# Patient Record
Sex: Female | Born: 1975 | Race: White | Hispanic: No | Marital: Married | Smoking: Never smoker
Health system: Southern US, Community
[De-identification: ages and names within clinical notes are randomized; demographics above are authoritative.]

## PROBLEM LIST (undated history)

## (undated) DIAGNOSIS — M1990 Unspecified osteoarthritis, unspecified site: Secondary | ICD-10-CM

## (undated) DIAGNOSIS — G43909 Migraine, unspecified, not intractable, without status migrainosus: Secondary | ICD-10-CM

## (undated) DIAGNOSIS — Z9889 Other specified postprocedural states: Secondary | ICD-10-CM

## (undated) DIAGNOSIS — Z8719 Personal history of other diseases of the digestive system: Secondary | ICD-10-CM

## (undated) DIAGNOSIS — T8859XA Other complications of anesthesia, initial encounter: Secondary | ICD-10-CM

## (undated) DIAGNOSIS — Z87442 Personal history of urinary calculi: Secondary | ICD-10-CM

## (undated) DIAGNOSIS — F32A Depression, unspecified: Secondary | ICD-10-CM

## (undated) DIAGNOSIS — K219 Gastro-esophageal reflux disease without esophagitis: Secondary | ICD-10-CM

## (undated) DIAGNOSIS — F419 Anxiety disorder, unspecified: Secondary | ICD-10-CM

## (undated) DIAGNOSIS — R112 Nausea with vomiting, unspecified: Secondary | ICD-10-CM

## (undated) HISTORY — DX: Unspecified osteoarthritis, unspecified site: M19.90

## (undated) HISTORY — DX: Migraine, unspecified, not intractable, without status migrainosus: G43.909

## (undated) HISTORY — DX: Gastro-esophageal reflux disease without esophagitis: K21.9

## (undated) HISTORY — DX: Depression, unspecified: F32.A

## (undated) HISTORY — PX: WISDOM TOOTH EXTRACTION: SHX21

## (undated) HISTORY — PX: DILATION AND CURETTAGE OF UTERUS: SHX78

## (undated) HISTORY — DX: Anxiety disorder, unspecified: F41.9

---

## 1998-06-21 ENCOUNTER — Other Ambulatory Visit: Admission: RE | Admit: 1998-06-21 | Discharge: 1998-06-21 | Payer: Self-pay | Admitting: Obstetrics and Gynecology

## 1998-12-27 ENCOUNTER — Other Ambulatory Visit: Admission: RE | Admit: 1998-12-27 | Discharge: 1998-12-27 | Payer: Self-pay | Admitting: Gynecology

## 2000-04-25 ENCOUNTER — Other Ambulatory Visit: Admission: RE | Admit: 2000-04-25 | Discharge: 2000-04-25 | Payer: Self-pay | Admitting: Gynecology

## 2000-05-27 ENCOUNTER — Other Ambulatory Visit: Admission: RE | Admit: 2000-05-27 | Discharge: 2000-05-27 | Payer: Self-pay | Admitting: Gynecology

## 2000-08-20 ENCOUNTER — Ambulatory Visit (HOSPITAL_COMMUNITY): Admission: RE | Admit: 2000-08-20 | Discharge: 2000-08-20 | Payer: Self-pay | Admitting: Gynecology

## 2000-10-17 ENCOUNTER — Encounter: Payer: Self-pay | Admitting: Gynecology

## 2000-10-17 ENCOUNTER — Ambulatory Visit (HOSPITAL_COMMUNITY): Admission: RE | Admit: 2000-10-17 | Discharge: 2000-10-17 | Payer: Self-pay | Admitting: Gynecology

## 2001-03-09 ENCOUNTER — Other Ambulatory Visit: Admission: RE | Admit: 2001-03-09 | Discharge: 2001-03-09 | Payer: Self-pay | Admitting: Gynecology

## 2001-06-03 ENCOUNTER — Encounter: Admission: RE | Admit: 2001-06-03 | Discharge: 2001-09-01 | Payer: Self-pay | Admitting: Gynecology

## 2001-09-16 ENCOUNTER — Inpatient Hospital Stay (HOSPITAL_COMMUNITY): Admission: AD | Admit: 2001-09-16 | Discharge: 2001-09-18 | Payer: Self-pay | Admitting: Gynecology

## 2001-10-21 ENCOUNTER — Other Ambulatory Visit: Admission: RE | Admit: 2001-10-21 | Discharge: 2001-10-21 | Payer: Self-pay | Admitting: Gynecology

## 2002-10-21 ENCOUNTER — Other Ambulatory Visit: Admission: RE | Admit: 2002-10-21 | Discharge: 2002-10-21 | Payer: Self-pay | Admitting: Gynecology

## 2002-11-09 ENCOUNTER — Emergency Department (HOSPITAL_COMMUNITY): Admission: EM | Admit: 2002-11-09 | Discharge: 2002-11-09 | Payer: Self-pay | Admitting: Emergency Medicine

## 2003-07-27 ENCOUNTER — Other Ambulatory Visit: Admission: RE | Admit: 2003-07-27 | Discharge: 2003-07-27 | Payer: Self-pay | Admitting: Gynecology

## 2004-02-15 ENCOUNTER — Inpatient Hospital Stay (HOSPITAL_COMMUNITY): Admission: AD | Admit: 2004-02-15 | Discharge: 2004-02-15 | Payer: Self-pay | Admitting: Gynecology

## 2004-02-17 ENCOUNTER — Inpatient Hospital Stay (HOSPITAL_COMMUNITY): Admission: AD | Admit: 2004-02-17 | Discharge: 2004-02-19 | Payer: Self-pay | Admitting: Gynecology

## 2004-03-30 ENCOUNTER — Other Ambulatory Visit: Admission: RE | Admit: 2004-03-30 | Discharge: 2004-03-30 | Payer: Self-pay | Admitting: Gynecology

## 2005-04-08 ENCOUNTER — Other Ambulatory Visit: Admission: RE | Admit: 2005-04-08 | Discharge: 2005-04-08 | Payer: Self-pay | Admitting: Gynecology

## 2005-04-23 ENCOUNTER — Encounter: Admission: RE | Admit: 2005-04-23 | Discharge: 2005-04-23 | Payer: Self-pay | Admitting: Gynecology

## 2006-04-14 ENCOUNTER — Other Ambulatory Visit: Admission: RE | Admit: 2006-04-14 | Discharge: 2006-04-14 | Payer: Self-pay | Admitting: Gynecology

## 2007-02-06 ENCOUNTER — Encounter: Payer: Self-pay | Admitting: Gynecology

## 2007-02-06 ENCOUNTER — Ambulatory Visit (HOSPITAL_BASED_OUTPATIENT_CLINIC_OR_DEPARTMENT_OTHER): Admission: RE | Admit: 2007-02-06 | Discharge: 2007-02-06 | Payer: Self-pay | Admitting: Gynecology

## 2007-05-28 ENCOUNTER — Other Ambulatory Visit: Admission: RE | Admit: 2007-05-28 | Discharge: 2007-05-28 | Payer: Self-pay | Admitting: Gynecology

## 2008-11-15 ENCOUNTER — Ambulatory Visit (HOSPITAL_COMMUNITY): Admission: RE | Admit: 2008-11-15 | Discharge: 2008-11-15 | Payer: Self-pay | Admitting: Obstetrics and Gynecology

## 2010-02-12 ENCOUNTER — Encounter: Admission: RE | Admit: 2010-02-12 | Discharge: 2010-02-12 | Payer: Self-pay | Admitting: Obstetrics and Gynecology

## 2011-02-05 NOTE — Op Note (Signed)
Kathleen Bird, Kathleen Bird            ACCOUNT NO.:  [id number]   MEDICAL RECORD NO.:  [id number]          PATIENT TYPE:  AMB   LOCATION:  [facility]                         FACILITY:  [facility]   PHYSICIAN:  Timothy P. Fontaine, M.D.DATE OF BIRTH:  [birth year]   DATE OF PROCEDURE:  02/06/2007  DATE OF DISCHARGE:                               OPERATIVE REPORT   PREOPERATIVE DIAGNOSIS:  Missed abortion.   POSTOPERATIVE DIAGNOSIS:  Missed abortion.   PROCEDURE:  Suction dilatation and curettage/   SURGEON:  Timothy P. Fontaine, M.D.   ANESTHETIC:  MAC with 1% lidocaine paracervical block.   ESTIMATED BLOOD LOSS:  Minimal.   COMPLICATIONS:  None.   SPECIMEN:  Products of conception.   FINDINGS:  EUA:  External, BUS, vagina grossly normal.  Cervix normal.  Uterus anteverted, normal-sized to mildly enlarged, soft.  Adnexa  without masses.  A #7 suction curette used.  Gross POC retrieved.   PROCEDURE:  The patient was taken to the operating room, underwent IV  sedation and was placed in low dorsal lithotomy position and received a  perineal and vaginal preparation with Betadine solution and an EUA was  performed.  The patient voided immediately prior to moving to the OR and  a catheterization was not done.  The patient was draped in the usual  fashion, cervix visualized with a speculum, paracervical block using 1%  lidocaine used, total of 10 mL.  Single-tooth tenaculum, anterior lip  stabilization.  The cervix was gently and gradually dilated to admit the  #7 suction curette.  A suction curettage was performed, gross POC  retrieved.  A gentle sharp curettage was performed to assure complete  emptying.  There was a mild arcuate pattern to the uterine cavity with  the sharp curettage that was noted.  The instruments were removed,  hemostasis visualized.  The speculum removed.  The patient placed in  supine position, was taken to the recovery room in good condition having  tolerated the  procedure well.      Timothy P. Fontaine, M.D.  Electronically Signed     TPF/MEDQ  D:  02/06/2007  T:  02/06/2007  Job:  [id number]

## 2011-02-05 NOTE — H&P (Signed)
[name], [name]            ACCOUNT NO.:  [id number]   MEDICAL RECORD NO.:  [id number]          PATIENT TYPE:  AMB   LOCATION:  [facility]                         FACILITY:  [facility]   PHYSICIAN:  Timothy P. Fontaine, M.D.DATE OF BIRTH:  [birth year]   DATE OF ADMISSION:  DATE OF DISCHARGE:                              HISTORY & PHYSICAL   CHIEF COMPLAINT:  Missed abortion.   HISTORY OF PRESENT ILLNESS:  [name], P2, [name] presents  with cramping and some spotting.  Ultrasound revealed intrauterine  pregnancy with large yolk sac.  Fetal pole identified.  No cardiac  activity.  Subchorionic hematoma identified all consistent with a missed  abortion.  The patient was reviewed the options to include expectant  management versus D&C and she wants to proceed with suction D&C.  Her  blood type is B+.   PAST MEDICAL HISTORY:  Uncomplicated.   PAST SURGICAL HISTORY:  Uncomplicated.   ALLERGIES:  No medications.   REVIEW OF SYSTEMS:  Noncontributory.   FAMILY HISTORY:  Noncontributory.   SOCIAL HISTORY:  Noncontributory.   ADMISSION PHYSICAL EXAMINATION:  VITAL SIGNS:  Afebrile.  Vital signs  are stable.  HEENT:  Normal.  LUNGS:  Clear.  CARDIAC:  Regular rate.  No rubs, murmurs or gallops.  ABDOMINAL EXAM:  Benign.  PELVIC:  External BUS.  Vagina normal.  Cervix grossly normal, closed.  Uterus soft.  Grossly normal size, midline, mobile and nontender.  Adnexa without masses or tenderness.   ASSESSMENT:  [name] P2 [name] missed abortion 6 weeks for  suction D&C.  The options for expectant management as well as chemical-  stimulated abortion such as Cytotec/RU486 discussed and the patient  elects for suction D&C.  The expected intraoperative and postoperative  courses, the intraoperative and postoperative risks were reviewed to  include bleeding, transfusion, infection, uterine perforation, damage to  internal organs,  bowel, bladder, ureters, vessels  and nerves necessitating major  exploratory reparative surgeries, future reparative surgeries, ostomy  formation, bowel resection all discussed, understood and accepted.  The  patient's questions were answered to her satisfaction and she is ready  to proceed with surg      Timothy P. Fontaine, M.D.  Electronically Signed     TPF/MEDQ  D:  02/06/2007  T:  02/06/2007  Job:  [id number]

## 2011-02-08 NOTE — H&P (Signed)
NAME:  Kathleen Bird, Kathleen Bird                      ACCOUNT NO.:  [id number]   MEDICAL RECORD NO.:  [id number]                   PATIENT TYPE:  MAT   LOCATION:  [facility]                                 FACILITY:  [facility]   PHYSICIAN:  Timothy P. Fontaine, M.D.           DATE OF BIRTH:  September 17, 1976   DATE OF ADMISSION:  02/17/2004  DATE OF DISCHARGE:                                HISTORY & PHYSICAL   CHIEF COMPLAINT:  Term pregnancy, favorable cervix.   HISTORY OF PRESENT ILLNESS:  A 35 year old, G2, P77 female at [redacted] weeks  gestation, admitted for induction due to favorable cervix at term.  She was  found to be 2-3 cm dilated, 50% effaced, -1 to -2 station on her last  obstetric visit, and is starting to have increasing bouts of contractions,  and is admitted for induction.  For the remainder of her history and  physical, see her [place] exam.   PHYSICAL EXAMINATION:  HEENT:  Normal.  LUNGS:  Clear.  CARDIAC:  Regular rate.  No murmurs, rubs, or gallops.  ABDOMEN:  Gravid.  Vertex fetus consistent with term pregnancy.  Positive  fetal heart tones.  Cervix 2-3 cm, 50%, -2 station, vertex presentation.   ASSESSMENT AND PLAN:  A 35 year old, G2, P3 female, term gestation,  favorable cervix for induction.  Beta strep is negative.  The options of  continuing expectant management versus induction was discussed, and the  patient would prefer induction.                                               Timothy P. Audie Box, M.D.    TPF/MEDQ  D:  02/15/2004  T:  02/16/2004  Job:  [id number]

## 2011-02-08 NOTE — Discharge Summary (Signed)
[facility]  Patient:    Kathleen Bird, Kathleen Bird Visit Number: [id number] MRN: [id number]          Service Type: OBS Location: 910A [id number] [id number] Attending Physician:  Douglass Rivers Dictated by:   Antony Contras, [facility] Admit Date:  09/16/2001 Discharge Date: 09/18/2001                             Discharge Summary  DISCHARGE DIAGNOSES: 1. Intrauterine pregnancy at term. 2. Spontaneous onset of labor.  PROCEDURE:  Normal spontaneous vaginal delivery of a viable infant over a intact perineum with repair of second degree laceration.  HISTORY OF PRESENT ILLNESS:  The patient is a 35 year old primigravida with a last menstrual period of 12/17/00, estimated date of confinement 09/23/01. Prenatal history includes history of ______ pregnancy with recurrent urinary tract infections, marginal placenta previa which resolved at 20 weeks.  LABORATORY DATA:  Blood type B+, antibody screen negative.  RPR nonreactive. Rubella immune.  GBS is negative.  HOSPITAL COURSE AND TREATMENT:  The patient was admitted on 09/16/01, spontaneous onset of labor.  Cervix on admission was 3 cm, 80% effaced.  The patient progressed to complete dilatation.  Delivered an Apgar 20 and 50 female infant weighing 7 pounds 1 ounce over intact perineum with repair of second degree laceration.  Terminal meconium was noted.  POSTPARTUM COURSE:  The patient remained afebrile, had no difficulty voiding, was able to be discharged in satisfactory condition on her second postpartum day.  CBC showed a hematocrit of 27.3, hemoglobin 9.2, white blood cell count 10.3, platelets 193.  DISPOSITION:  Follow up in six weeks.  DISCHARGE MEDICATIONS: 1. Continue prenatal vitamins and iron. 2. Percocet. 3. Motrin for pain. Dictated by:   Antony Contras, [facility] Attending Physician:  Douglass Rivers DD:  10/02/01 TD:  10/04/01 Job: [id number]

## 2012-11-11 ENCOUNTER — Other Ambulatory Visit: Payer: Self-pay | Admitting: Obstetrics and Gynecology

## 2012-11-11 DIAGNOSIS — Z803 Family history of malignant neoplasm of breast: Secondary | ICD-10-CM

## 2013-01-04 ENCOUNTER — Other Ambulatory Visit: Payer: Self-pay | Admitting: Obstetrics and Gynecology

## 2013-01-04 DIAGNOSIS — R928 Other abnormal and inconclusive findings on diagnostic imaging of breast: Secondary | ICD-10-CM

## 2013-01-15 ENCOUNTER — Ambulatory Visit
Admission: RE | Admit: 2013-01-15 | Discharge: 2013-01-15 | Disposition: A | Discharge status: Home / Self Care | Payer: BC Managed Care – PPO | Source: Ambulatory Visit | Attending: Obstetrics and Gynecology | Admitting: Obstetrics and Gynecology

## 2013-01-15 DIAGNOSIS — R928 Other abnormal and inconclusive findings on diagnostic imaging of breast: Secondary | ICD-10-CM

## 2013-06-10 ENCOUNTER — Other Ambulatory Visit: Payer: Self-pay | Admitting: Family Medicine

## 2013-06-10 DIAGNOSIS — Z8249 Family history of ischemic heart disease and other diseases of the circulatory system: Secondary | ICD-10-CM

## 2013-06-18 ENCOUNTER — Ambulatory Visit
Admission: RE | Admit: 2013-06-18 | Discharge: 2013-06-18 | Disposition: A | Discharge status: Home / Self Care | Payer: BC Managed Care – PPO | Source: Ambulatory Visit | Attending: Family Medicine | Admitting: Family Medicine

## 2013-06-18 DIAGNOSIS — Z8249 Family history of ischemic heart disease and other diseases of the circulatory system: Secondary | ICD-10-CM

## 2013-06-18 MED ORDER — GADOBENATE DIMEGLUMINE 529 MG/ML IV SOLN
10.0000 mL | Freq: Once | INTRAVENOUS | Status: AC | PRN
  Administered 2013-06-18: 10 mL via INTRAVENOUS

## 2013-12-07 ENCOUNTER — Other Ambulatory Visit: Payer: Self-pay | Admitting: Endocrinology

## 2013-12-07 DIAGNOSIS — E049 Nontoxic goiter, unspecified: Secondary | ICD-10-CM

## 2013-12-09 ENCOUNTER — Ambulatory Visit
Admission: RE | Admit: 2013-12-09 | Discharge: 2013-12-09 | Disposition: A | Discharge status: Home / Self Care | Payer: BC Managed Care – PPO | Source: Ambulatory Visit | Attending: Endocrinology | Admitting: Endocrinology

## 2013-12-09 DIAGNOSIS — E049 Nontoxic goiter, unspecified: Secondary | ICD-10-CM

## 2014-05-27 ENCOUNTER — Other Ambulatory Visit: Payer: Self-pay | Admitting: Obstetrics and Gynecology

## 2014-05-31 LAB — CYTOLOGY - PAP

## 2014-10-03 ENCOUNTER — Other Ambulatory Visit: Payer: Self-pay | Admitting: Endocrinology

## 2014-10-03 DIAGNOSIS — E049 Nontoxic goiter, unspecified: Secondary | ICD-10-CM

## 2014-12-12 ENCOUNTER — Ambulatory Visit
Admission: RE | Admit: 2014-12-12 | Discharge: 2014-12-12 | Disposition: A | Discharge status: Home / Self Care | Payer: BC Managed Care – PPO | Source: Ambulatory Visit | Attending: Endocrinology | Admitting: Endocrinology

## 2014-12-12 DIAGNOSIS — E049 Nontoxic goiter, unspecified: Secondary | ICD-10-CM

## 2015-01-10 ENCOUNTER — Other Ambulatory Visit: Payer: Self-pay | Admitting: Obstetrics and Gynecology

## 2015-01-11 LAB — CYTOLOGY - PAP

## 2015-12-29 ENCOUNTER — Other Ambulatory Visit: Payer: Self-pay | Admitting: Endocrinology

## 2015-12-29 DIAGNOSIS — E049 Nontoxic goiter, unspecified: Secondary | ICD-10-CM

## 2016-01-02 ENCOUNTER — Ambulatory Visit
Admission: RE | Admit: 2016-01-02 | Discharge: 2016-01-02 | Disposition: A | Discharge status: Home / Self Care | Payer: BC Managed Care – PPO | Source: Ambulatory Visit | Attending: Endocrinology | Admitting: Endocrinology

## 2016-01-02 DIAGNOSIS — E049 Nontoxic goiter, unspecified: Secondary | ICD-10-CM

## 2017-01-21 ENCOUNTER — Other Ambulatory Visit: Payer: Self-pay | Admitting: Obstetrics and Gynecology

## 2017-01-21 DIAGNOSIS — N6310 Unspecified lump in the right breast, unspecified quadrant: Secondary | ICD-10-CM

## 2017-01-21 DIAGNOSIS — N644 Mastodynia: Secondary | ICD-10-CM

## 2017-01-24 ENCOUNTER — Ambulatory Visit
Admission: RE | Admit: 2017-01-24 | Discharge: 2017-01-24 | Disposition: A | Discharge status: Home / Self Care | Payer: BC Managed Care – PPO | Source: Ambulatory Visit | Attending: Obstetrics and Gynecology | Admitting: Obstetrics and Gynecology

## 2017-01-24 DIAGNOSIS — N6310 Unspecified lump in the right breast, unspecified quadrant: Secondary | ICD-10-CM

## 2017-01-24 DIAGNOSIS — N644 Mastodynia: Secondary | ICD-10-CM

## 2017-05-15 ENCOUNTER — Other Ambulatory Visit: Payer: Self-pay | Admitting: Gastroenterology

## 2017-05-15 DIAGNOSIS — R1310 Dysphagia, unspecified: Secondary | ICD-10-CM

## 2017-05-16 ENCOUNTER — Ambulatory Visit
Admission: RE | Admit: 2017-05-16 | Discharge: 2017-05-16 | Disposition: A | Discharge status: Home / Self Care | Payer: BC Managed Care – PPO | Source: Ambulatory Visit | Attending: Gastroenterology | Admitting: Gastroenterology

## 2017-05-16 DIAGNOSIS — R1310 Dysphagia, unspecified: Secondary | ICD-10-CM

## 2017-05-19 ENCOUNTER — Other Ambulatory Visit: Payer: BC Managed Care – PPO

## 2017-11-25 ENCOUNTER — Other Ambulatory Visit: Payer: Self-pay | Admitting: Endocrinology

## 2017-11-25 DIAGNOSIS — E049 Nontoxic goiter, unspecified: Secondary | ICD-10-CM

## 2017-12-22 ENCOUNTER — Ambulatory Visit
Admission: RE | Admit: 2017-12-22 | Discharge: 2017-12-22 | Disposition: A | Discharge status: Home / Self Care | Payer: BC Managed Care – PPO | Source: Ambulatory Visit | Attending: Endocrinology | Admitting: Endocrinology

## 2017-12-22 DIAGNOSIS — E049 Nontoxic goiter, unspecified: Secondary | ICD-10-CM

## 2018-02-02 ENCOUNTER — Other Ambulatory Visit: Payer: Self-pay | Admitting: Obstetrics and Gynecology

## 2018-02-02 DIAGNOSIS — N6315 Unspecified lump in the right breast, overlapping quadrants: Secondary | ICD-10-CM

## 2018-02-02 DIAGNOSIS — N6310 Unspecified lump in the right breast, unspecified quadrant: Principal | ICD-10-CM

## 2018-02-04 ENCOUNTER — Ambulatory Visit
Admission: RE | Admit: 2018-02-04 | Discharge: 2018-02-04 | Disposition: A | Discharge status: Home / Self Care | Payer: BC Managed Care – PPO | Source: Ambulatory Visit | Attending: Obstetrics and Gynecology | Admitting: Obstetrics and Gynecology

## 2018-02-04 DIAGNOSIS — N6310 Unspecified lump in the right breast, unspecified quadrant: Principal | ICD-10-CM

## 2018-02-04 DIAGNOSIS — N6315 Unspecified lump in the right breast, overlapping quadrants: Secondary | ICD-10-CM

## 2019-04-21 ENCOUNTER — Other Ambulatory Visit: Payer: Self-pay | Admitting: Endocrinology

## 2019-04-21 DIAGNOSIS — E049 Nontoxic goiter, unspecified: Secondary | ICD-10-CM

## 2019-04-30 ENCOUNTER — Other Ambulatory Visit: Payer: BC Managed Care – PPO

## 2019-05-07 ENCOUNTER — Ambulatory Visit
Admission: RE | Admit: 2019-05-07 | Discharge: 2019-05-07 | Disposition: A | Discharge status: Home / Self Care | Payer: BC Managed Care – PPO | Source: Ambulatory Visit | Attending: Endocrinology | Admitting: Endocrinology

## 2019-05-07 DIAGNOSIS — E049 Nontoxic goiter, unspecified: Secondary | ICD-10-CM

## 2020-01-19 DIAGNOSIS — N920 Excessive and frequent menstruation with regular cycle: Secondary | ICD-10-CM | POA: Insufficient documentation

## 2020-06-02 ENCOUNTER — Ambulatory Visit: Payer: Self-pay

## 2020-06-02 ENCOUNTER — Other Ambulatory Visit: Payer: Self-pay

## 2020-06-02 ENCOUNTER — Other Ambulatory Visit: Payer: Self-pay | Admitting: Occupational Medicine

## 2020-06-02 DIAGNOSIS — M25562 Pain in left knee: Secondary | ICD-10-CM

## 2020-12-22 HISTORY — PX: COLONOSCOPY: SHX174

## 2020-12-22 HISTORY — PX: UPPER GASTROINTESTINAL ENDOSCOPY: SHX188

## 2021-02-15 ENCOUNTER — Other Ambulatory Visit: Payer: Self-pay | Admitting: Urology

## 2021-02-15 DIAGNOSIS — N201 Calculus of ureter: Secondary | ICD-10-CM

## 2021-02-22 ENCOUNTER — Encounter (HOSPITAL_BASED_OUTPATIENT_CLINIC_OR_DEPARTMENT_OTHER): Payer: Self-pay | Admitting: Urology

## 2021-02-22 NOTE — Progress Notes (Signed)
Patient to arrive at 1030 on 02/26/2021. History and medications reviewed. Pre-procedure instructions given. NPO after MN on Sunday except for clear liquids until 0830. Driver secured.

## 2021-02-23 NOTE — Progress Notes (Signed)
Patient instructed to arrive at [place] on 02/26/2021 due to procedure time change. Clear liquids until 0715.

## 2021-02-25 NOTE — H&P (Signed)
H&P  Chief Complaint: Rt sided kidney stone  History of Present Illness: Kathleen Bird is a 45 y.o. year old female presents for ESL for mgmt of a 4 mm Rt UPJ stone.  Past Medical History:  Diagnosis Date  . History of kidney stones     Past Surgical History:  Procedure Laterality Date  . tendon replacement Right 07/2020    Home Medications:  No medications prior to admission.    Allergies: No Known Allergies  No family history on file.  Social History:  has no history on file for tobacco use, alcohol use, and drug use.  ROS: A complete review of systems was performed.  All systems are negative except for pertinent findings as noted.  Physical Exam:  Vital signs in last 24 hours: BP: ()/()  Arterial Line BP: ()/()  General:  Alert and oriented, No acute distress HEENT: Normocephalic, atraumatic Neck: No JVD or lymphadenopathy Cardiovascular: Regular rate  Lungs: Normal inspiratory/expiratory excursion Abdomen: Soft, nontender, nondistended, no abdominal masses Back: No CVA tenderness Extremities: No edema Neurologic: Grossly intact  Laboratory Data:  No results found for this or any previous visit (from the past 24 hour(s)). No results found for this or any previous visit (from the past 240 hour(s)). Creatinine: No results for input(s): CREATININE in the last 168 hours.  Radiologic Imaging: No results found.  Impression/Assessment:  Rt UPJ stone  Plan:  ESL  [name] [name] 02/25/2021, 7:26 PM  [name]. Gillie Fleites MD

## 2021-02-26 ENCOUNTER — Other Ambulatory Visit: Payer: Self-pay

## 2021-02-26 ENCOUNTER — Ambulatory Visit (HOSPITAL_BASED_OUTPATIENT_CLINIC_OR_DEPARTMENT_OTHER)
Admission: RE | Admit: 2021-02-26 | Discharge: 2021-02-26 | Disposition: A | Discharge status: Home / Self Care | Payer: BC Managed Care – PPO | Attending: Urology | Admitting: Urology

## 2021-02-26 ENCOUNTER — Encounter (HOSPITAL_BASED_OUTPATIENT_CLINIC_OR_DEPARTMENT_OTHER)
Admission: RE | Disposition: A | Discharge status: Home / Self Care | Payer: Self-pay | Source: Home / Self Care | Attending: Urology

## 2021-02-26 ENCOUNTER — Ambulatory Visit (HOSPITAL_COMMUNITY): Payer: BC Managed Care – PPO

## 2021-02-26 ENCOUNTER — Encounter (HOSPITAL_BASED_OUTPATIENT_CLINIC_OR_DEPARTMENT_OTHER): Payer: Self-pay | Admitting: Urology

## 2021-02-26 DIAGNOSIS — Z87442 Personal history of urinary calculi: Secondary | ICD-10-CM | POA: Insufficient documentation

## 2021-02-26 DIAGNOSIS — Z538 Procedure and treatment not carried out for other reasons: Secondary | ICD-10-CM | POA: Insufficient documentation

## 2021-02-26 DIAGNOSIS — N201 Calculus of ureter: Secondary | ICD-10-CM | POA: Diagnosis present

## 2021-02-26 HISTORY — PX: EXTRACORPOREAL SHOCK WAVE LITHOTRIPSY: SHX1557

## 2021-02-26 HISTORY — DX: Personal history of urinary calculi: Z87.442

## 2021-02-26 LAB — POCT PREGNANCY, URINE: Preg Test, Ur: NEGATIVE

## 2021-02-26 SURGERY — LITHOTRIPSY, ESWL
Anesthesia: LOCAL | Laterality: Right

## 2021-02-26 MED ORDER — DIPHENHYDRAMINE HCL 25 MG PO CAPS
ORAL_CAPSULE | ORAL | Status: AC
  Filled 2021-02-26: qty 1

## 2021-02-26 MED ORDER — DIAZEPAM 5 MG PO TABS
10.0000 mg | ORAL_TABLET | ORAL | Status: AC
  Administered 2021-02-26: 10 mg via ORAL

## 2021-02-26 MED ORDER — CIPROFLOXACIN HCL 500 MG PO TABS
ORAL_TABLET | ORAL | Status: AC
  Filled 2021-02-26: qty 1

## 2021-02-26 MED ORDER — CIPROFLOXACIN HCL 500 MG PO TABS
500.0000 mg | ORAL_TABLET | ORAL | Status: AC
  Administered 2021-02-26: 500 mg via ORAL

## 2021-02-26 MED ORDER — DIAZEPAM 5 MG PO TABS
ORAL_TABLET | ORAL | Status: AC
  Filled 2021-02-26: qty 2

## 2021-02-26 MED ORDER — DIPHENHYDRAMINE HCL 25 MG PO CAPS
25.0000 mg | ORAL_CAPSULE | ORAL | Status: AC
  Administered 2021-02-26: 25 mg via ORAL

## 2021-02-26 MED ORDER — SODIUM CHLORIDE 0.9 % IV SOLN: INTRAVENOUS | Status: DC

## 2021-02-26 NOTE — Discharge Instructions (Signed)
Keep scheduled AUS appt   Post Anesthesia Home Care Instructions  Activity: Get plenty of rest for the remainder of the day. A responsible individual must stay with you for 24 hours following the procedure.  For the next 24 hours, DO NOT: -Drive a car -Paediatric nurse -Drink alcoholic beverages -Take any medication unless instructed by your physician -Make any legal decisions or sign important papers.  Meals: Start with liquid foods such as gelatin or soup. Progress to regular foods as tolerated. Avoid greasy, spicy, heavy foods. If nausea and/or vomiting occur, drink only clear liquids until the nausea and/or vomiting subsides. Call your physician if vomiting continues.  Special Instructions/Symptoms: Your throat may feel dry or sore from the anesthesia or the breathing tube placed in your throat during surgery. If this causes discomfort, gargle with warm salt water. The discomfort should disappear within 24 hours.

## 2021-02-26 NOTE — Op Note (Addendum)
See [employer] OP note scanned into chart--case cancelled as pt passed stone

## 2021-02-27 ENCOUNTER — Encounter (HOSPITAL_BASED_OUTPATIENT_CLINIC_OR_DEPARTMENT_OTHER): Payer: Self-pay | Admitting: Urology

## 2021-03-08 ENCOUNTER — Other Ambulatory Visit: Payer: Self-pay | Admitting: Student

## 2021-03-08 ENCOUNTER — Other Ambulatory Visit: Payer: Self-pay | Admitting: Family Medicine

## 2021-03-08 ENCOUNTER — Other Ambulatory Visit: Payer: Self-pay | Admitting: Geriatric Medicine

## 2021-03-08 DIAGNOSIS — M542 Cervicalgia: Secondary | ICD-10-CM

## 2021-03-13 ENCOUNTER — Other Ambulatory Visit: Payer: Self-pay | Admitting: Family Medicine

## 2021-03-13 DIAGNOSIS — M7989 Other specified soft tissue disorders: Secondary | ICD-10-CM

## 2021-03-16 ENCOUNTER — Emergency Department (HOSPITAL_COMMUNITY): Payer: BC Managed Care – PPO

## 2021-03-16 ENCOUNTER — Encounter (HOSPITAL_COMMUNITY): Payer: Self-pay

## 2021-03-16 ENCOUNTER — Emergency Department (HOSPITAL_COMMUNITY)
Admission: EM | Admit: 2021-03-16 | Discharge: 2021-03-16 | Disposition: A | Discharge status: Home / Self Care | Payer: BC Managed Care – PPO | Attending: Emergency Medicine | Admitting: Emergency Medicine

## 2021-03-16 ENCOUNTER — Other Ambulatory Visit: Payer: Self-pay

## 2021-03-16 DIAGNOSIS — N390 Urinary tract infection, site not specified: Secondary | ICD-10-CM | POA: Diagnosis not present

## 2021-03-16 DIAGNOSIS — R109 Unspecified abdominal pain: Secondary | ICD-10-CM | POA: Diagnosis present

## 2021-03-16 DIAGNOSIS — R319 Hematuria, unspecified: Secondary | ICD-10-CM

## 2021-03-16 DIAGNOSIS — R110 Nausea: Secondary | ICD-10-CM | POA: Insufficient documentation

## 2021-03-16 DIAGNOSIS — R300 Dysuria: Secondary | ICD-10-CM

## 2021-03-16 LAB — CBC WITH DIFFERENTIAL/PLATELET
Abs Immature Granulocytes: 0.01 10*3/uL (ref 0.00–0.07)
Basophils Absolute: 0 10*3/uL (ref 0.0–0.1)
Basophils Relative: 0 %
Eosinophils Absolute: 0 10*3/uL (ref 0.0–0.5)
Eosinophils Relative: 0 %
HCT: 44.4 % (ref 36.0–46.0)
Hemoglobin: 14.2 g/dL (ref 12.0–15.0)
Immature Granulocytes: 0 %
Lymphocytes Relative: 28 %
Lymphs Abs: 1.3 10*3/uL (ref 0.7–4.0)
MCH: 30.5 pg (ref 26.0–34.0)
MCHC: 32 g/dL (ref 30.0–36.0)
MCV: 95.3 fL (ref 80.0–100.0)
Monocytes Absolute: 0.5 10*3/uL (ref 0.1–1.0)
Monocytes Relative: 10 %
Neutro Abs: 2.8 10*3/uL (ref 1.7–7.7)
Neutrophils Relative %: 62 %
Platelets: 187 10*3/uL (ref 150–400)
RBC: 4.66 MIL/uL (ref 3.87–5.11)
RDW: 12.9 % (ref 11.5–15.5)
WBC: 4.6 10*3/uL (ref 4.0–10.5)
nRBC: 0 % (ref 0.0–0.2)

## 2021-03-16 LAB — COMPREHENSIVE METABOLIC PANEL
ALT: 18 U/L (ref 0–44)
AST: 23 U/L (ref 15–41)
Albumin: 4.4 g/dL (ref 3.5–5.0)
Alkaline Phosphatase: 56 U/L (ref 38–126)
Anion gap: 8 (ref 5–15)
BUN: 19 mg/dL (ref 6–20)
CO2: 24 mmol/L (ref 22–32)
Calcium: 9.3 mg/dL (ref 8.9–10.3)
Chloride: 107 mmol/L (ref 98–111)
Creatinine, Ser: 0.88 mg/dL (ref 0.44–1.00)
GFR, Estimated: >60 mL/min (ref >60)
Glucose, Bld: 92 mg/dL (ref 70–99)
Potassium: 4.2 mmol/L (ref 3.5–5.1)
Sodium: 139 mmol/L (ref 135–145)
Total Bilirubin: 0.5 mg/dL (ref 0.3–1.2)
Total Protein: 7.4 g/dL (ref 6.5–8.1)

## 2021-03-16 LAB — URINALYSIS, ROUTINE W REFLEX MICROSCOPIC
Bilirubin Urine: NEGATIVE
Glucose, UA: NEGATIVE mg/dL
Ketones, ur: NEGATIVE mg/dL
Leukocytes,Ua: NEGATIVE
Nitrite: NEGATIVE
Protein, ur: NEGATIVE mg/dL
RBC / HPF: >50 RBC/hpf — ABNORMAL HIGH (ref 0–5)
Specific Gravity, Urine: 1.01 (ref 1.005–1.030)
pH: 6 (ref 5.0–8.0)

## 2021-03-16 LAB — I-STAT BETA HCG BLOOD, ED (MC, WL, AP ONLY): I-stat hCG, quantitative: <5 m[IU]/mL (ref <5)

## 2021-03-16 MED ORDER — IBUPROFEN 200 MG PO TABS
600.0000 mg | ORAL_TABLET | Freq: Once | ORAL | Status: AC
  Administered 2021-03-16: 600 mg via ORAL
  Filled 2021-03-16: qty 3

## 2021-03-16 MED ORDER — SODIUM CHLORIDE 0.9 % IV SOLN
25.0000 mg | Freq: Once | INTRAVENOUS | Status: AC
  Administered 2021-03-16: 25 mg via INTRAVENOUS
  Filled 2021-03-16: qty 25

## 2021-03-16 MED ORDER — PHENAZOPYRIDINE HCL 200 MG PO TABS: 200.0000 mg | ORAL_TABLET | Freq: Three times a day (TID) | ORAL | 0 refills | Status: DC

## 2021-03-16 MED ORDER — SODIUM CHLORIDE 0.9 % IV BOLUS
500.0000 mL | Freq: Once | INTRAVENOUS | Status: AC
  Administered 2021-03-16: 500 mL via INTRAVENOUS

## 2021-03-16 MED ORDER — CEPHALEXIN 500 MG PO CAPS: 1000.0000 mg | ORAL_CAPSULE | Freq: Two times a day (BID) | ORAL | 0 refills | Status: DC

## 2021-03-16 MED ORDER — HYDROCODONE-ACETAMINOPHEN 5-325 MG PO TABS: 1.0000 | ORAL_TABLET | Freq: Four times a day (QID) | ORAL | 0 refills | Status: DC | PRN

## 2021-03-16 MED ORDER — HYDROMORPHONE HCL 1 MG/ML IJ SOLN
1.0000 mg | Freq: Once | INTRAMUSCULAR | Status: AC
Start: 2021-03-16 — End: 2021-03-16
  Administered 2021-03-16: 1 mg via INTRAVENOUS
  Filled 2021-03-16: qty 1

## 2021-03-16 MED ORDER — SODIUM CHLORIDE 0.9 % IV SOLN
1.0000 g | Freq: Once | INTRAVENOUS | Status: AC
  Administered 2021-03-16: 1 g via INTRAVENOUS
  Filled 2021-03-16: qty 10

## 2021-03-16 MED ORDER — ONDANSETRON HCL 4 MG/2ML IJ SOLN
4.0000 mg | Freq: Once | INTRAMUSCULAR | Status: AC
  Administered 2021-03-16: 4 mg via INTRAVENOUS
  Filled 2021-03-16: qty 2

## 2021-03-16 MED ORDER — PHENAZOPYRIDINE HCL 200 MG PO TABS: 200.0000 mg | ORAL_TABLET | Freq: Three times a day (TID) | ORAL | Status: DC

## 2021-03-16 NOTE — ED Triage Notes (Signed)
Patient reports that she went to have a lithotripsy 2 weeks ago, but was told she did not have a stone. Patient went to a urologist 4 days ago for a check up. Patient states she has hematuria, increased right lower back pain, and nausea.

## 2021-03-16 NOTE — ED Provider Notes (Signed)
Emergency Medicine Provider Triage Evaluation Note  Kathleen Bird , a 45 y.o. female  was evaluated in triage.  Pt complains of worsening right lower back and flank pain. Started two weeks ago, worse over last 24 hours.   Review of Systems  Positive: Flank/back pain, hematuria Negative: Vomiting, fever  Physical Exam  BP 115/83 (BP Location: Left Arm)   Pulse 74   Temp 98.2 F (36.8 C) (Oral)   Resp 16   Ht 5\' 3"  (1.6 m)   Wt 72.6 kg   LMP 02/21/2021 (Exact Date)   SpO2 100%   BMI 28.34 kg/m  Gen:   Awake, no distress   Resp:  Normal effort  MSK:   Moves extremities without difficulty  Other:    Medical Decision Making  Medically screening exam initiated at 6:44 PM.  Appropriate orders placed.  GWYNDOLYN GUILFORD was informed that the remainder of the evaluation will be completed by another provider, this initial triage assessment does not replace that evaluation, and the importance of remaining in the ED until their evaluation is complete.     Lorayne Bender, PA-C 03/16/21 1847    Charlesetta Shanks, MD 03/21/21 [phone]

## 2021-03-16 NOTE — Discharge Instructions (Signed)
1.  A urine culture has been obtained.  You are being placed on Keflex twice daily.  You were given a dose of Rocephin in the emergency department.  Follow-up on your urine culture to determine if any changes are needed to your antibiotic. 2.  Follow-up with urology as scheduled.  Let them know you are seen in the emergency department for pain. 3.  Return to the emergency department if your symptoms return, if you develop fever or other concerning symptoms.

## 2021-03-16 NOTE — ED Provider Notes (Signed)
[facility] DEPT Provider Note   CSN: [id number] Arrival date & time: 03/16/21  1750     History Chief Complaint  Patient presents with   Back Pain   Hematuria    Kathleen Bird is a 45 y.o. female.  HPI Patient was diagnosed with a right-sided kidney stone.  She was going in for lithotripsy but they identified the stone was no longer present.  She reports that since that time however she has developed a spasmodic and deep pain in her right flank.  She reports she also has urinary frequency.  No fevers no chills.  She has been somewhat nauseated.    Past Medical History:  Diagnosis Date   History of kidney stones     There are no problems to display for this patient.   Past Surgical History:  Procedure Laterality Date   EXTRACORPOREAL SHOCK WAVE LITHOTRIPSY Right 02/26/2021   Procedure: RIGHT EXTRACORPOREAL SHOCK WAVE LITHOTRIPSY (ESWL);  Surgeon: Franchot Gallo, MD;  Location: [facility];  Service: Urology;  Laterality: Right;   tendon replacement Right 07/2020     OB History   No obstetric history on file.     Family History  Problem Relation Age of Onset   Healthy Mother    Glaucoma Father     Social History   Tobacco Use   Smoking status: Never   Smokeless tobacco: Never  Vaping Use   Vaping Use: Never used  Substance Use Topics   Alcohol use: Not Currently   Drug use: Never    Home Medications Prior to Admission medications   Medication Sig Start Date End Date Taking? Authorizing Provider  cephALEXin (KEFLEX) 500 MG capsule Take 2 capsules (1,000 mg total) by mouth 2 (two) times daily. 03/16/21  Yes Charlesetta Shanks, MD  HYDROcodone-acetaminophen (NORCO/VICODIN) 5-325 MG tablet Take 1-2 tablets by mouth every 6 (six) hours as needed for moderate pain or severe pain. 03/16/21  Yes Charlesetta Shanks, MD  phenazopyridine (PYRIDIUM) 200 MG tablet Take 1 tablet (200 mg total) by mouth 3 (three) times daily.  03/16/21  Yes Tessie Ordaz, Jeannie Done, MD  escitalopram (LEXAPRO) 20 MG tablet Take 20 mg by mouth daily.    [provider]  Galcanezumab-gnlm 120 MG/ML SOAJ Inject 120 mg/mL into the skin every 30 (thirty) days.    [provider]  HYDROcodone-acetaminophen (NORCO/VICODIN) 5-325 MG tablet Take 1 tablet by mouth every 6 (six) hours as needed for moderate pain.    [provider]  Multiple Vitamin (MULTIVITAMIN) tablet Take 1 tablet by mouth daily.    [provider]    Allergies    Patient has no known allergies.  Review of Systems   Review of Systems 10 systems reviewed negative except as per HPI Physical Exam Updated Vital Signs BP 100/73   Pulse 75   Temp 98.2 F (36.8 C) (Oral)   Resp 18   Ht 5\' 3"  (1.6 m)   Wt 72.6 kg   LMP 02/21/2021 (Exact Date) Comment: negative HCG blood test 03-16-2021  SpO2 98%   BMI 28.34 kg/m   Physical Exam Constitutional:      Comments: Patient is alert nontoxic.  She is uncomfortable in appearance.  Mental status clear  HENT:     Head: Normocephalic.     Mouth/Throat:     Pharynx: Oropharynx is clear.  Eyes:     Extraocular Movements: Extraocular movements intact.  Cardiovascular:     Rate and Rhythm: Normal rate and  regular rhythm.  Pulmonary:     Effort: Pulmonary effort is normal.     Breath sounds: Normal breath sounds.  Abdominal:     Comments: Abdomen soft.  Mild to moderate right flank tenderness.  Musculoskeletal:        General: No swelling or tenderness. Normal range of motion.     Right lower leg: No edema.     Left lower leg: No edema.  Skin:    General: Skin is warm and dry.  Neurological:     General: No focal deficit present.     Mental Status: She is oriented to person, place, and time.     Coordination: Coordination normal.  Psychiatric:        Mood and Affect: Mood normal.    ED Results / Procedures / Treatments   Labs (all labs ordered are listed, but only abnormal results are  displayed) Labs Reviewed  URINALYSIS, ROUTINE W REFLEX MICROSCOPIC - Abnormal; Notable for the following components:      Result Value   Hgb urine dipstick LARGE (*)    RBC / HPF >50 (*)    Bacteria, UA RARE (*)    All other components within normal limits  URINE CULTURE  COMPREHENSIVE METABOLIC PANEL  CBC WITH DIFFERENTIAL/PLATELET  I-STAT BETA HCG BLOOD, ED ([facility], [facility], [facility] ONLY)    EKG None  Radiology CT Renal Stone Study  Result Date: 03/16/2021 CLINICAL DATA:  Hematuria, increasing right lower back pain, nausea. Recently went for lithotripsy and was told she did not have a stone. EXAM: CT ABDOMEN AND PELVIS WITHOUT CONTRAST TECHNIQUE: Multidetector CT imaging of the abdomen and pelvis was performed following the standard protocol without IV contrast. COMPARISON:  CT 02/14/2021 FINDINGS: Lower chest: Scattered calcified granulomata in the lung bases. Some bandlike scarring and/or atelectasis present in the lung bases. Normal heart size. No pericardial effusion. Redemonstration of the solid, soft tissue density along the right lateral aspect of the distal thoracic esophagus measuring 3.6 by 2.8 cm in size, not significantly changed comparison prior. Hepatobiliary: No worrisome focal liver lesions. Smooth liver surface contour. Normal hepatic attenuation. Normal gallbladder and biliary tree without visible calcified gallstone. Pancreas: No pancreatic ductal dilatation or surrounding inflammatory changes. Spleen: Scattered calcified granulomata throughout the spleen. Normal in size. No concerning splenic lesions. Adrenals/Urinary Tract: Normal adrenal glands. Kidneys are symmetric in size and normally located. There are 2 nonobstructing calculi present in the left kidney measuring 3 mm in the upper pole and 4 mm in the lower pole. A smaller 2 mm calculus previously seen in the lower pole left kidney on comparison CT is no longer visualized within the renal parenchyma, with a similar appearing  calculus now positioned at the left ureterovesicular junction. No resulting hydronephrosis. An additional punctate radiodensity possibly reflecting calcification or Randall's plaque is seen upper pole left kidney (4/96). Previously seen right ureteral calculus is no longer visualized along course of the right ureter nor within the urinary bladder. Stomach/Bowel: Distal esophagus, stomach and duodenal sweep are unremarkable. No small bowel wall thickening or dilatation. No evidence of obstruction. Appendix is not visualized. No focal inflammation the vicinity of the cecum to suggest an occult appendicitis. No colonic dilatation or wall thickening. Vascular/Lymphatic: No significant vascular findings are present. No enlarged abdominal or pelvic lymph nodes. Reproductive: Anteverted uterus.  No concerning adnexal lesions. Other: No abdominopelvic free fluid or free gas. No bowel containing hernias. Musculoskeletal: No acute osseous abnormality or suspicious osseous lesion. The osseous structures appear  diffusely demineralized which may limit detection of small or nondisplaced fractures. Schmorl's node formation superior endplate T11, stable from prior. IMPRESSION: 1. Tiny 2 mm calculus positioned at the left UVJ, appears migrated from the lower pole left kidney when compared to prior imaging. No resulting hydroureteronephrosis. 2. Additional nonobstructing calculi in the left renal parenchyma as well as possible developing calcification or Randall's plaque. 3. Previously seen 4 mm right ureteral calculus is no longer evident, possible interval passage. 4. Scattered calcified granulomata throughout the lung bases in the spleen likely reflecting sequela prior granulomatous disease. 5. Solid soft tissue density along the right lateral aspect of the distal thoracic esophagus measuring 3.6 x 2.8 cm, could reflect a forgot duplication cyst or esophageal leiomyoma, remains incompletely characterized on this unenhanced CT.  Consider outpatient contrast enhanced CT or MR imaging for further characterization. Electronically Signed   By: Lovena Le M.D.   On: 03/16/2021 21:39    Procedures Procedures   Medications Ordered in ED Medications  phenazopyridine (PYRIDIUM) tablet 200 mg (has no administration in time range)  cefTRIAXone (ROCEPHIN) 1 g in sodium chloride 0.9 % 100 mL IVPB (1 g Intravenous New Bag/Given 03/16/21 2241)  ibuprofen (ADVIL) tablet 600 mg (600 mg Oral Given 03/16/21 2014)  HYDROmorphone (DILAUDID) injection 1 mg (1 mg Intravenous Given 03/16/21 2105)  ondansetron (ZOFRAN) injection 4 mg (4 mg Intravenous Given 03/16/21 2105)  sodium chloride 0.9 % bolus 500 mL (0 mLs Intravenous Stopped 03/16/21 2304)  promethazine (PHENERGAN) 25 mg in sodium chloride 0.9 % 50 mL IVPB (0 mg Intravenous Stopped 03/16/21 2240)  sodium chloride 0.9 % bolus 500 mL (500 mLs Intravenous New Bag/Given 03/16/21 2220)    ED Course  I have reviewed the triage vital signs and the nursing notes.  Pertinent labs & imaging results that were available during my care of the patient were reviewed by me and considered in my medical decision making (see chart for details).    MDM Rules/Calculators/A&P                          Patient has had recurrence of right flank pain.  Also dysuria and urgency.  Urine test positive for blood.  We will culture.  Patient being treated empirically for UTI with Pyridium and Keflex.  Patient aware to follow-up on culture results.  Patient is scheduled to follow-up with urology.  At this time she is being treated for nausea and persistent pain.  Anticipate with symptoms controlled patient will be stable for discharge and follow-up with urology. Final Clinical Impression(s) / ED Diagnoses Final diagnoses:  Dysuria  Hematuria, unspecified type  Right flank pain    Rx / DC Orders ED Discharge Orders          Ordered    phenazopyridine (PYRIDIUM) 200 MG tablet  3 times daily        03/16/21  2302    cephALEXin (KEFLEX) 500 MG capsule  2 times daily        03/16/21 2302    HYDROcodone-acetaminophen (NORCO/VICODIN) 5-325 MG tablet  Every 6 hours PRN        03/16/21 2302             Charlesetta Shanks, MD 03/16/21 2310

## 2021-03-17 ENCOUNTER — Other Ambulatory Visit: Payer: BC Managed Care – PPO

## 2021-03-17 ENCOUNTER — Ambulatory Visit
Admission: RE | Admit: 2021-03-17 | Discharge: 2021-03-17 | Disposition: A | Discharge status: Home / Self Care | Payer: BC Managed Care – PPO | Source: Ambulatory Visit | Attending: Family Medicine | Admitting: Family Medicine

## 2021-03-17 DIAGNOSIS — M7989 Other specified soft tissue disorders: Secondary | ICD-10-CM

## 2021-03-18 LAB — URINE CULTURE: Culture: 30000 — AB

## 2021-03-19 ENCOUNTER — Telehealth: Payer: Self-pay | Admitting: Emergency Medicine

## 2021-03-19 NOTE — Telephone Encounter (Signed)
Post ED Visit - Positive Culture Follow-up  Culture report reviewed by antimicrobial stewardship pharmacist: [place] Team []  Nathan Batchelder, [address] []  [place], Pharm.D., BCPS AQ-ID []  Heide Guile, Pharm.D., BCPS []  Parks Neptune, Pharm.D., BCPS []  Petron[place], [place].D., BCPS, AAHIVP []  [place], Pharm.D., BCPS, AAHIVP []  Legrand Como, PharmD, BCPS []  Salome Arnt, PharmD, BCPS []  Johnnette Gourd, PharmD, BCPS []  Hughes Better, PharmD []  Leeroy Cha, PharmD, BCPS []  Laqueta Linden, PharmD  [place] Team []  [address], PharmD []  Leodis Sias, PharmD []  Lindell Spar, PharmD []  Royetta Asal, Rph []  Graylin Shiver) Rema Fendt, PharmD []  Glennon Mac, PharmD []  Arlyn Dunning, PharmD []  Netta Cedars, PharmD []  Dia Sitter, PharmD []  Leone Haven, PharmD []  Gretta Arab, PharmD []  Theodis Shove, PharmD []  Peggyann Juba, PharmD   Positive urine culture Treated with cephalexin, organism sensitive to the same and no further patient follow-up is required at this time.  Reuel Boom 03/19/2021, 8:28 AM

## 2021-03-21 ENCOUNTER — Other Ambulatory Visit: Payer: Self-pay | Admitting: Family Medicine

## 2021-03-21 DIAGNOSIS — M7989 Other specified soft tissue disorders: Secondary | ICD-10-CM

## 2021-03-22 ENCOUNTER — Other Ambulatory Visit: Payer: Self-pay | Admitting: Family Medicine

## 2021-03-22 DIAGNOSIS — R222 Localized swelling, mass and lump, trunk: Secondary | ICD-10-CM

## 2021-03-28 ENCOUNTER — Other Ambulatory Visit: Payer: Self-pay | Admitting: Gastroenterology

## 2021-03-28 ENCOUNTER — Telehealth: Payer: Self-pay

## 2021-03-28 DIAGNOSIS — D130 Benign neoplasm of esophagus: Secondary | ICD-10-CM

## 2021-03-28 NOTE — Telephone Encounter (Signed)
-----   Message from Mauri Pole, MD sent at 03/27/2021 11:17 AM EDT ----- Ok.  Beth, please schedule patient for direct EGD at [facility] 7/25 at 8 AM.  Thanks VN ----- Message ----- From: [name], MD Sent: 03/27/2021   8:24 AM EDT To: Mauri Pole, MD  She's coming to clinic later this week.  I will schedule her after your scope.    Thanks,  H. ----- Message ----- From: Mauri Pole, MD Sent: 03/24/2021   1:46 PM EDT To: Greggory Keen, LPN, #  Yes, I can try to get her in soon.  When are you planning to do surgery? Margarette Asal ----- Message ----- From: [name], MD Sent: 03/24/2021   1:19 PM EDT To: Mauri Pole, MD  Hey,  I'm seeing this lady in a few weeks for a potential esophageal leiomyoma.  Would you be able to scope her prior to surgery?  Thanks,  H. ----- Message ----- From: Laury Deep, RN Sent: 03/22/2021  10:51 AM EDT To: Baxter Hire, MD  Dr. Kipp Brood,   Please review this new referral and scans to let us know the urgency on getting her in.  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  Thanks  cindy   IMPRESSION: 1. Smooth, well-circumscribed mass along the rightward aspect of the distal esophagus which may arise from the esophagus. This mass measures 4.5 x 3.9 x 3.2 cm. Its etiology is uncertain. As noted before, major differential considerations include mass arising from the esophagus, statistically most likely an esophageal leiomyoma, and foregut duplication cyst. Further evaluation with contrast enhanced chest CT to assess for enhancement could be helpful in this circumstance. Potentially MR could also be helpful in this regard. No similar masses of this nature  elsewhere.

## 2021-03-28 NOTE — Telephone Encounter (Signed)
Scheduled the EGD.  Called the patient. No answer. Left her a message with the information of being scheduled and needing to speak with her.  She is scheduled for 04/16/21 at 8:00 am She needs a Pre-Visit scheduled soon.

## 2021-03-29 NOTE — Telephone Encounter (Signed)
Patient calls back. She is okay with the appointments and times that have been scheduled for her.

## 2021-03-29 NOTE — Telephone Encounter (Signed)
Called patient. No answer. Left her a message to call back asap. Scheduled her an appointment for Pre-visit on 04/10/21 at 8:30 am. She has an active MY CHART. I am hoping this will trigger a call from her about the appointment.

## 2021-04-03 ENCOUNTER — Other Ambulatory Visit: Payer: BC Managed Care – PPO

## 2021-04-04 ENCOUNTER — Ambulatory Visit
Admission: RE | Admit: 2021-04-04 | Discharge: 2021-04-04 | Disposition: A | Discharge status: Home / Self Care | Payer: BC Managed Care – PPO | Source: Ambulatory Visit | Attending: Gastroenterology | Admitting: Gastroenterology

## 2021-04-04 ENCOUNTER — Other Ambulatory Visit: Payer: Self-pay | Admitting: Gastroenterology

## 2021-04-04 DIAGNOSIS — D130 Benign neoplasm of esophagus: Secondary | ICD-10-CM

## 2021-04-10 ENCOUNTER — Ambulatory Visit (AMBULATORY_SURGERY_CENTER): Payer: BC Managed Care – PPO

## 2021-04-10 ENCOUNTER — Ambulatory Visit
Admission: RE | Admit: 2021-04-10 | Discharge: 2021-04-10 | Disposition: A | Discharge status: Home / Self Care | Payer: BC Managed Care – PPO | Source: Ambulatory Visit | Attending: Family Medicine | Admitting: Family Medicine

## 2021-04-10 ENCOUNTER — Other Ambulatory Visit: Payer: Self-pay

## 2021-04-10 VITALS — Ht 62.5 in | Wt 159.0 lb

## 2021-04-10 DIAGNOSIS — R222 Localized swelling, mass and lump, trunk: Secondary | ICD-10-CM

## 2021-04-10 DIAGNOSIS — K2289 Other specified disease of esophagus: Secondary | ICD-10-CM

## 2021-04-10 DIAGNOSIS — R9389 Abnormal findings on diagnostic imaging of other specified body structures: Secondary | ICD-10-CM

## 2021-04-10 MED ORDER — IOPAMIDOL (ISOVUE-300) INJECTION 61%
75.0000 mL | Freq: Once | INTRAVENOUS | Status: AC | PRN
  Administered 2021-04-10: 75 mL via INTRAVENOUS

## 2021-04-10 NOTE — Progress Notes (Addendum)
Pre visit completed via phone call;  Patient verified name, DOB, and address; No egg or soy allergy known to patient  No issues with past sedation with any surgeries or procedures Patient denies ever being told they had issues or difficulty with intubation  No FH of Malignant Hyperthermia No diet pills per patient No home 02 use per patient  No blood thinners per patient  Pt reports issues with constipation at this time- uses Miralax 2-3 times per week No A fib or A flutter  EMMI video via [place] 19 guidelines implemented in PV today with Pt and RN   Due to the COVID-19 pandemic we are asking patients to follow certain guidelines.  Pt aware of COVID protocols and [facility] guidelines

## 2021-04-16 ENCOUNTER — Other Ambulatory Visit: Payer: Self-pay

## 2021-04-16 ENCOUNTER — Encounter: Payer: Self-pay | Admitting: Gastroenterology

## 2021-04-16 ENCOUNTER — Ambulatory Visit (AMBULATORY_SURGERY_CENTER): Payer: BC Managed Care – PPO | Admitting: Gastroenterology

## 2021-04-16 VITALS — BP 111/59 | HR 83 | Temp 98.0°F | Resp 20 | Ht 62.5 in | Wt 159.0 lb

## 2021-04-16 DIAGNOSIS — K319 Disease of stomach and duodenum, unspecified: Secondary | ICD-10-CM | POA: Diagnosis not present

## 2021-04-16 DIAGNOSIS — K208 Other esophagitis without bleeding: Secondary | ICD-10-CM | POA: Diagnosis not present

## 2021-04-16 DIAGNOSIS — K2970 Gastritis, unspecified, without bleeding: Secondary | ICD-10-CM

## 2021-04-16 DIAGNOSIS — K2289 Other specified disease of esophagus: Secondary | ICD-10-CM

## 2021-04-16 DIAGNOSIS — K2100 Gastro-esophageal reflux disease with esophagitis, without bleeding: Secondary | ICD-10-CM

## 2021-04-16 DIAGNOSIS — K259 Gastric ulcer, unspecified as acute or chronic, without hemorrhage or perforation: Secondary | ICD-10-CM | POA: Diagnosis not present

## 2021-04-16 DIAGNOSIS — K449 Diaphragmatic hernia without obstruction or gangrene: Secondary | ICD-10-CM | POA: Diagnosis not present

## 2021-04-16 DIAGNOSIS — R9389 Abnormal findings on diagnostic imaging of other specified body structures: Secondary | ICD-10-CM

## 2021-04-16 MED ORDER — OMEPRAZOLE 40 MG PO CPDR: 40.0000 mg | DELAYED_RELEASE_CAPSULE | Freq: Every day | ORAL | 3 refills | Status: DC

## 2021-04-16 MED ORDER — SODIUM CHLORIDE 0.9 % IV SOLN: 500.0000 mL | Freq: Once | INTRAVENOUS | Status: DC

## 2021-04-16 NOTE — Progress Notes (Signed)
PT taken to PACU. Monitors in place. VSS. Report given to RN. 

## 2021-04-16 NOTE — Patient Instructions (Addendum)
No NSAIDS (Non-Steroidal anti-inflammatory drugs).  (These include, aspirin, aspirin-containing products, ibuprofen, advil, motrin, naproxen, aleve, goody powders, etc) Tylenol is ok to take as needed, see label for instructions.   Handouts given for esophagitis, ulcers, H Pylori testing, GERD and Hiatal Hernia.  Call [phone] to make a follow up visit to the clinic in 3 months.  Pick up new Rx for Prilosec (Omeprazole), take daily 30 mins before eating.  YOU HAD AN ENDOSCOPIC PROCEDURE TODAY AT [facility]:   Refer to the procedure report that was given to you for any specific questions about what was found during the examination.  If the procedure report does not answer your questions, please call your gastroenterologist to clarify.  If you requested that your care partner not be given the details of your procedure findings, then the procedure report has been included in a sealed envelope for you to review at your convenience later.  YOU SHOULD EXPECT: Some feelings of bloating in the abdomen. Passage of more gas than usual.  Walking can help get rid of the air that was put into your GI tract during the procedure and reduce the bloating. If you had a lower endoscopy (such as a colonoscopy or flexible sigmoidoscopy) you may notice spotting of blood in your stool or on the toilet paper. If you underwent a bowel prep for your procedure, you may not have a normal bowel movement for a few days.  Please Note:  You might notice some irritation and congestion in your nose or some drainage.  This is from the oxygen used during your procedure.  There is no need for concern and it should clear up in a day or so.  SYMPTOMS TO REPORT IMMEDIATELY:  Following upper endoscopy (EGD)  Vomiting of blood or coffee ground material  New chest pain or pain under the shoulder blades  Painful or persistently difficult swallowing  New shortness of breath  Fever of 100F or higher  Black,  tarry-looking stools  For urgent or emergent issues, a gastroenterologist can be reached at any hour by calling [phone]. Do not use MyChart messaging for urgent concerns.   DIET:  We do recommend a small meal at first, but then you may proceed to your regular diet.  Drink plenty of fluids but you should avoid alcoholic beverages for 24 hours.  ACTIVITY:  You should plan to take it easy for the rest of today and you should NOT DRIVE or use heavy machinery until tomorrow (because of the sedation medicines used during the test).    FOLLOW UP: Our staff will call the number listed on your records 48-72 hours following your procedure to check on you and address any questions or concerns that you may have regarding the information given to you following your procedure. If we do not reach you, we will leave a message.  We will attempt to reach you two times.  During this call, we will ask if you have developed any symptoms of COVID 19. If you develop any symptoms (ie: fever, flu-like symptoms, shortness of breath, cough etc.) before then, please call [phone].  If you test positive for Covid 19 in the 2 weeks post procedure, please call and report this information to [place].    If any biopsies were taken you will be contacted by phone or by letter within the next 1-3 weeks.  Please call us at [phone] if you have not heard about the biopsies in 3 weeks.  SIGNATURES/CONFIDENTIALITY: You and/or your care partner have signed paperwork which will be entered into your electronic medical record.  These signatures attest to the fact that that the information above on your After Visit Summary has been reviewed and is understood.  Full responsibility of the confidentiality of this discharge information lies with you and/or your care-partner.

## 2021-04-16 NOTE — Progress Notes (Signed)
Called to room to assist during endoscopic procedure.  Patient ID and intended procedure confirmed with present staff. Received instructions for my participation in the procedure from the performing physician.  

## 2021-04-16 NOTE — Progress Notes (Signed)
Medical history reviewed with no changes noted since PV. VS assessed by C.W 

## 2021-04-16 NOTE — Op Note (Signed)
[place] Patient Name: Kathleen Bird Procedure Date: 04/16/2021 7:38 AM MRN: [id number] Endoscopist: Mauri Pole , MD Age: 45 Referring MD:  Date of Birth: 27-Apr-1976 Gender: Female Account #: [id number] Procedure:                Upper GI endoscopy Indications:              Dysphagia, Abnormal CT of the GI tract Medicines:                Monitored Anesthesia Care Procedure:                Pre-Anesthesia Assessment:                           - Prior to the procedure, a History and Physical                            was performed, and patient medications and                            allergies were reviewed. The patient's tolerance of                            previous anesthesia was also reviewed. The risks                            and benefits of the procedure and the sedation                            options and risks were discussed with the patient.                            All questions were answered, and informed consent                            was obtained. Prior Anticoagulants: The patient has                            taken no previous anticoagulant or antiplatelet                            agents. ASA Grade Assessment: II - A patient with                            mild systemic disease. After reviewing the risks                            and benefits, the patient was deemed in                            satisfactory condition to undergo the procedure.                           After obtaining informed consent, the endoscope was  passed under direct vision. Throughout the                            procedure, the patient's blood pressure, pulse, and                            oxygen saturations were monitored continuously. The                            Endoscope was introduced through the mouth, and                            advanced to the second part of duodenum. The upper                            GI  endoscopy was accomplished without difficulty.                            The patient tolerated the procedure well. Scope In: Scope Out: Findings:                 LA Grade C (one or more mucosal breaks continuous                            between tops of 2 or more mucosal folds, less than                            75% circumference) esophagitis with no bleeding was                            found 33 to 35 cm from the incisors. Biopsies were                            taken with a cold forceps for histology.                           The lumen of the middle third of the esophagus was                            mildly dilated with distal esophageal tortousity.                           The gastroesophageal flap valve was visualized                            endoscopically and classified as Hill Grade III                            (minimal fold, loose to endoscope, hiatal hernia                            likely).  A 3 cm hiatal hernia was present.                           Few non-bleeding superficial gastric ulcers with no                            stigmata of bleeding were found in the gastric                            antrum and in the prepyloric region of the stomach.                            The largest lesion was 6 mm in largest dimension.                            Biopsies were taken with a cold forceps for                            Helicobacter pylori testing.                           The cardia and gastric fundus were normal on                            retroflexion.                           The examined duodenum was normal. Complications:            No immediate complications. Estimated Blood Loss:     Estimated blood loss was minimal. Impression:               - LA Grade C reflux esophagitis with no bleeding.                            Biopsied.                           - Dilation in the middle third of the esophagus.                            - Gastroesophageal flap valve classified as Hill                            Grade III (minimal fold, loose to endoscope, hiatal                            hernia likely).                           - 3 cm hiatal hernia.                           - Non-bleeding gastric ulcers with no stigmata of  bleeding. Biopsied.                           - Normal examined duodenum. Recommendation:           - Resume previous diet.                           - Continue present medications.                           - Await pathology results.                           - No aspirin, ibuprofen, naproxen, or other                            non-steroidal anti-inflammatory drugs.                           - Use Prilosec (omeprazole) 40 mg PO daily. Rx 90                            days with 3 refills                           - Follow up with Dr Kipp Brood as scheduled                           - Return to GI office in 3 months, please call to                            schedule appointment Mauri Pole, MD 04/16/2021 8:17:21 AM This report has been signed electronically.

## 2021-04-18 ENCOUNTER — Telehealth: Payer: Self-pay | Admitting: *Deleted

## 2021-04-18 NOTE — Telephone Encounter (Signed)
  Follow up Call-  Call back number 04/16/2021  Post procedure Call Back phone  # [phone]  Permission to leave phone message Yes  Some recent data might be hidden     Patient questions:  Do you have a fever, pain , or abdominal swelling? No. Pain Score  0 *  Have you tolerated food without any problems? Yes.    Have you been able to return to your normal activities? Yes.    Do you have any questions about your discharge instructions: Diet   No. Medications  No. Follow up visit  No.  Do you have questions or concerns about your Care? No.  Actions: * If pain score is 4 or above: No action needed, pain <4.

## 2021-04-20 ENCOUNTER — Institutional Professional Consult (permissible substitution): Payer: BC Managed Care – PPO | Admitting: Thoracic Surgery (Cardiothoracic Vascular Surgery)

## 2021-04-20 ENCOUNTER — Other Ambulatory Visit: Payer: Self-pay

## 2021-04-20 VITALS — BP 111/79 | HR 90 | Resp 20 | Ht 62.0 in | Wt 158.0 lb

## 2021-04-20 DIAGNOSIS — J9859 Other diseases of mediastinum, not elsewhere classified: Secondary | ICD-10-CM | POA: Diagnosis not present

## 2021-04-20 DIAGNOSIS — N289 Disorder of kidney and ureter, unspecified: Secondary | ICD-10-CM | POA: Diagnosis not present

## 2021-04-20 DIAGNOSIS — E282 Polycystic ovarian syndrome: Secondary | ICD-10-CM | POA: Insufficient documentation

## 2021-04-20 DIAGNOSIS — D378 Neoplasm of uncertain behavior of other specified digestive organs: Secondary | ICD-10-CM | POA: Diagnosis not present

## 2021-04-20 DIAGNOSIS — E049 Nontoxic goiter, unspecified: Secondary | ICD-10-CM | POA: Insufficient documentation

## 2021-04-20 DIAGNOSIS — D398 Neoplasm of uncertain behavior of other specified female genital organs: Secondary | ICD-10-CM

## 2021-04-20 NOTE — Progress Notes (Signed)
[address]       [place] [address]             [phone]                    Haleema M Africa [facility] Medical Record [id number] Date of Birth: 12/12/1975  Referring: Jerry Caras, MD Primary Care: Harlan Stains, MD Primary Cardiologist: None  Chief Complaint:    Chief Complaint  Patient presents with   esophageal Leiomyoma    Surgical consult, Chest CT 04/10/21, Upper GI endoscopy 04/16/21,  BARIUM SWALLOW  04/04/21    History of Present Illness:    Kathleen Bird 45 y.o. female presents for surgical evaluation of esophageal mass.  This was found incidentally on cross-sectional imaging when she presented to the emergency department with a kidney stone.  She has a long history of esophageal reflux, and is undergone multiple dilations in the past.  She continues to use Prilosec with good relief of her symptoms.  Of late she has had some dysphagia which is of concern to her.  She denies any weight loss.      Zubrod Score: At the time of surgery this patient's most appropriate activity status/level should be described as: '[x]'$     0    Normal activity, no symptoms '[]'$     1    Restricted in physical strenuous activity but ambulatory, able to do out light work '[]'$     2    Ambulatory and capable of self care, unable to do work activities, up and about               >50 % of waking hours                              '[]'$     3    Only limited self care, in bed greater than 50% of waking hours '[]'$     4    Completely disabled, no self care, confined to bed or chair '[]'$     5    Moribund   Past Medical History:  Diagnosis Date   Anxiety    on meds   Arthritis    bilateral hands   Depression    on meds   GERD (gastroesophageal reflux disease)    on meds   History of kidney stones    Migraines    on meds    Past Surgical History:  Procedure Laterality Date   COLONOSCOPY  12/2020   [facility]   DILATION AND CURETTAGE OF UTERUS      EXTRACORPOREAL SHOCK WAVE LITHOTRIPSY Right 02/26/2021   Procedure: RIGHT EXTRACORPOREAL SHOCK WAVE LITHOTRIPSY (ESWL);  Surgeon: Franchot Gallo, MD;  Location: [facility];  Service: Urology;  Laterality: Right;   HAND SURGERY Left 2016   joint fusion in hand   tendon replacement Right 07/2020   UPPER GASTROINTESTINAL ENDOSCOPY  12/2020   WISDOM TOOTH EXTRACTION      Family History  Problem Relation Age of Onset   Healthy Mother    Glaucoma Father    Colon polyps Neg Hx    Colon cancer Neg Hx    Esophageal cancer Neg Hx    Stomach cancer Neg Hx    Rectal cancer Neg Hx      Social History   Tobacco Use  Smoking Status Never  Smokeless Tobacco Never  Social History   Substance and Sexual Activity  Alcohol Use Not Currently     Allergies  Allergen Reactions   Dilaudid [Hydromorphone Hcl] Nausea And Vomiting    Current Outpatient Medications  Medication Sig Dispense Refill   escitalopram (LEXAPRO) 20 MG tablet Take 20 mg by mouth daily.     Galcanezumab-gnlm 120 MG/ML SOAJ Inject 120 mg/mL into the skin every 30 (thirty) days.     Multiple Vitamin (MULTIVITAMIN) tablet Take 1 tablet by mouth daily.     omeprazole (PRILOSEC) 40 MG capsule Take 1 capsule (40 mg total) by mouth daily. 90 capsule 3   SUMAtriptan (IMITREX) 100 MG tablet Take 1 tablet by mouth daily as needed.     valACYclovir (VALTREX) 500 MG tablet Take 1 tablet by mouth as needed.     No current facility-administered medications for this visit.    Review of Systems  Constitutional: Negative.   Respiratory: Negative.    Cardiovascular: Negative.   Gastrointestinal:  Positive for heartburn and nausea.  Neurological: Negative.     PHYSICAL EXAMINATION: BP 111/79   Pulse 90   Resp 20   Ht '5\' 2"'$  (1.575 m)   Wt 158 lb (71.7 kg)   LMP 03/23/2021 (Exact Date)   SpO2 100% Comment: RA  BMI 28.90 kg/m  Physical Exam Constitutional:      General: She is not in acute  distress.    Appearance: Normal appearance. She is not ill-appearing or toxic-appearing.  Eyes:     Extraocular Movements: Extraocular movements intact.  Cardiovascular:     Rate and Rhythm: Normal rate.  Pulmonary:     Effort: Pulmonary effort is normal. No respiratory distress.  Abdominal:     General: Abdomen is flat. There is no distension.  Musculoskeletal:        General: Normal range of motion.     Cervical back: Normal range of motion.  Neurological:     General: No focal deficit present.     Mental Status: She is alert and oriented to person, place, and time.    Diagnostic Studies & Laboratory data:    CT Scan: Performed on 04/10/2021 No pathologic thoracic adenopathy. Numerous calcified mediastinal and hilar lymph nodes are identified in keeping with changes of old granulomatous disease. The middle mediastinal mass is again seen and is unchanged measuring 2.9 x 3.8 x 4.6 cm in greatest dimension. The mass demonstrates no significant enhancement when compared to prior examination. 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 Hypoenhancing esophageal leiomyoma is not completely excluded, but is considered less likely. Small hiatal hernia. The esophagus again demonstrates mass effect and is deviated to the left by the mediastinal mass.  EGD/EUS: Performed on 04/16/2021 - LA Grade C (one or more mucosal breaks continuous between tops of 2 or more mucosal folds, less than 75% circumference) esophagitis with no bleeding was found 33 to 35 cm from the incisors. Biopsies were taken with a cold forceps for histology. - The lumen of the middle third of the esophagus was mildly dilated with distal esophageal tortousity. - The gastroesophageal flap valve was visualized endoscopically and classified as Hill Grade III (minimal fold, loose to endoscope, hiatal hernia likely). - A 3 cm  hiatal hernia was present. - Few non-bleeding superficial gastric ulcers with no stigmata of bleeding were found in the gastric antrum and in the prepyloric region of the stomach. The largest lesion was 6 mm in  largest dimension. Biopsies were taken with a cold forceps for Helicobacter pylori testing. - The cardia and gastric fundus were normal on retroflexion. - The examined duodenum was normal.  Esophagram: Performed on 03/17/2021 Signs of extrinsic compression upon the esophagus in this patient with known paraesophageal lesion as seen on recent CT.   Question mild Schatzki's ring with otherwise normal appearance of the esophagus.   Tiny hiatal hernia.    Path:  1. Surgical [P], gastric antrum and gastric body - REACTIVE GASTROPATHY - NO H. PYLORI OR INTESTINAL METAPLASIA IDENTIFIED - SEE COMMENT 2. Surgical [P], esophageal bx - SQUAMOCOLUMNAR JUNCTION WITH CHRONIC INFLAMMATION - NO INTESTINAL METAPLASIA, DYSPLASIA OR MALIGNANCY IDENTIFIED       I have independently reviewed the above radiology studies  and reviewed the findings with the patient.   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 Small hiatal hernia with reflux and history of a Schatzki's ring Retroesophageal right subclavian artery  Assessment / Plan:   45 year old female who presents for evaluation of esophageal mass.  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  Her main symptoms are reflux and some dysphagia.  We  discussed several options, and I think that given the location of this potential duplication cyst close to the GE junction this could potentially be the source of her dysphagia and also be contributing to her gastroesophageal reflux.  I also explained that the aberrant right subclavian artery could also be playing a role with her dysphagia.  Her biggest concern however is the reflux.  We discussed options which involves a right robotic assisted thoracoscopy with resection of this esophageal mass as well as a robotic assisted laparoscopy and hiatal hernia repair with fundoplication.  She would like some time to think about the options.  Of note we may consider getting an MRI to differentiate between the duplication cyst versus a leiomyoma to help her make a decision.  She will contact us with a plan.       I  spent 40 minutes with the patient face to face counseling and coordination of care.    [name] 04/20/2021 2:37 PM

## 2021-04-20 NOTE — H&P (View-Only) (Signed)
[address]       [place] [address]             [phone]                    Kathleen Bird  Medical Record [id number] Date of Birth: 1975-12-14  Referring: Jerry Caras, MD Primary Care: Harlan Stains, MD Primary Cardiologist: None  Chief Complaint:    Chief Complaint  Patient presents with   esophageal Leiomyoma    Surgical consult, Chest CT 04/10/21, Upper GI endoscopy 04/16/21,  BARIUM SWALLOW  04/04/21    History of Present Illness:    Kathleen Bird 45 y.o. female presents for surgical evaluation of esophageal mass.  This was found incidentally on cross-sectional imaging when she presented to the emergency department with a kidney stone.  She has a long history of esophageal reflux, and is undergone multiple dilations in the past.  She continues to use Prilosec with good relief of her symptoms.  Of late she has had some dysphagia which is of concern to her.  She denies any weight loss.      Zubrod Score: At the time of surgery this patient's most appropriate activity status/level should be described as: '[x]'$     0    Normal activity, no symptoms '[]'$     1    Restricted in physical strenuous activity but ambulatory, able to do out light work '[]'$     2    Ambulatory and capable of self care, unable to do work activities, up and about               >50 % of waking hours                              '[]'$     3    Only limited self care, in bed greater than 50% of waking hours '[]'$     4    Completely disabled, no self care, confined to bed or chair '[]'$     5    Moribund   Past Medical History:  Diagnosis Date   Anxiety    on meds   Arthritis    bilateral hands   Depression    on meds   GERD (gastroesophageal reflux disease)    on meds   History of kidney stones    Migraines    on meds    Past Surgical History:  Procedure Laterality Date   COLONOSCOPY  12/2020   [facility]   DILATION AND CURETTAGE OF UTERUS      EXTRACORPOREAL SHOCK WAVE LITHOTRIPSY Right 02/26/2021   Procedure: RIGHT EXTRACORPOREAL SHOCK WAVE LITHOTRIPSY (ESWL);  Surgeon: Franchot Gallo, MD;  Location: [facility];  Service: Urology;  Laterality: Right;   HAND SURGERY Left 2016   joint fusion in hand   tendon replacement Right 07/2020   UPPER GASTROINTESTINAL ENDOSCOPY  12/2020   WISDOM TOOTH EXTRACTION      Family History  Problem Relation Age of Onset   Healthy Mother    Glaucoma Father    Colon polyps Neg Hx    Colon cancer Neg Hx    Esophageal cancer Neg Hx    Stomach cancer Neg Hx    Rectal cancer Neg Hx      Social History   Tobacco Use  Smoking Status Never  Smokeless Tobacco Never  Social History   Substance and Sexual Activity  Alcohol Use Not Currently     Allergies  Allergen Reactions   Dilaudid [Hydromorphone Hcl] Nausea And Vomiting    Current Outpatient Medications  Medication Sig Dispense Refill   escitalopram (LEXAPRO) 20 MG tablet Take 20 mg by mouth daily.     Galcanezumab-gnlm 120 MG/ML SOAJ Inject 120 mg/mL into the skin every 30 (thirty) days.     Multiple Vitamin (MULTIVITAMIN) tablet Take 1 tablet by mouth daily.     omeprazole (PRILOSEC) 40 MG capsule Take 1 capsule (40 mg total) by mouth daily. 90 capsule 3   SUMAtriptan (IMITREX) 100 MG tablet Take 1 tablet by mouth daily as needed.     valACYclovir (VALTREX) 500 MG tablet Take 1 tablet by mouth as needed.     No current facility-administered medications for this visit.    Review of Systems  Constitutional: Negative.   Respiratory: Negative.    Cardiovascular: Negative.   Gastrointestinal:  Positive for heartburn and nausea.  Neurological: Negative.     PHYSICAL EXAMINATION: BP 111/79   Pulse 90   Resp 20   Ht '5\' 2"'$  (1.575 m)   Wt 158 lb (71.7 kg)   LMP 03/23/2021 (Exact Date)   SpO2 100% Comment: RA  BMI 28.90 kg/m  Physical Exam Constitutional:      General: She is not in acute  distress.    Appearance: Normal appearance. She is not ill-appearing or toxic-appearing.  Eyes:     Extraocular Movements: Extraocular movements intact.  Cardiovascular:     Rate and Rhythm: Normal rate.  Pulmonary:     Effort: Pulmonary effort is normal. No respiratory distress.  Abdominal:     General: Abdomen is flat. There is no distension.  Musculoskeletal:        General: Normal range of motion.     Cervical back: Normal range of motion.  Neurological:     General: No focal deficit present.     Mental Status: She is alert and oriented to person, place, and time.    Diagnostic Studies & Laboratory data:    CT Scan: Performed on 04/10/2021 No pathologic thoracic adenopathy. Numerous calcified mediastinal and hilar lymph nodes are identified in keeping with changes of old granulomatous disease. The middle mediastinal mass is again seen and is unchanged measuring 2.9 x 3.8 x 4.6 cm in greatest dimension. The mass demonstrates no significant enhancement when compared to prior examination. 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 Hypoenhancing esophageal leiomyoma is not completely excluded, but is considered less likely. Small hiatal hernia. The esophagus again demonstrates mass effect and is deviated to the left by the mediastinal mass.  EGD/EUS: Performed on 04/16/2021 - LA Grade C (one or more mucosal breaks continuous between tops of 2 or more mucosal folds, less than 75% circumference) esophagitis with no bleeding was found 33 to 35 cm from the incisors. Biopsies were taken with a cold forceps for histology. - The lumen of the middle third of the esophagus was mildly dilated with distal esophageal tortousity. - The gastroesophageal flap valve was visualized endoscopically and classified as Hill Grade III (minimal fold, loose to endoscope, hiatal hernia likely). - A 3 cm  hiatal hernia was present. - Few non-bleeding superficial gastric ulcers with no stigmata of bleeding were found in the gastric antrum and in the prepyloric region of the stomach. The largest lesion was 6 mm in  largest dimension. Biopsies were taken with a cold forceps for Helicobacter pylori testing. - The cardia and gastric fundus were normal on retroflexion. - The examined duodenum was normal.  Esophagram: Performed on 03/17/2021 Signs of extrinsic compression upon the esophagus in this patient with known paraesophageal lesion as seen on recent CT.   Question mild Schatzki's ring with otherwise normal appearance of the esophagus.   Tiny hiatal hernia.    Path:  1. Surgical [P], gastric antrum and gastric body - REACTIVE GASTROPATHY - NO H. PYLORI OR INTESTINAL METAPLASIA IDENTIFIED - SEE COMMENT 2. Surgical [P], esophageal bx - SQUAMOCOLUMNAR JUNCTION WITH CHRONIC INFLAMMATION - NO INTESTINAL METAPLASIA, DYSPLASIA OR MALIGNANCY IDENTIFIED       I have independently reviewed the above radiology studies  and reviewed the findings with the patient.   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 Small hiatal hernia with reflux and history of a Schatzki's ring Retroesophageal right subclavian artery  Assessment / Plan:   45 year old female who presents for evaluation of esophageal mass.  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  Her main symptoms are reflux and some dysphagia.  We  discussed several options, and I think that given the location of this potential duplication cyst close to the GE junction this could potentially be the source of her dysphagia and also be contributing to her gastroesophageal reflux.  I also explained that the aberrant right subclavian artery could also be playing a role with her dysphagia.  Her biggest concern however is the reflux.  We discussed options which involves a right robotic assisted thoracoscopy with resection of this esophageal mass as well as a robotic assisted laparoscopy and hiatal hernia repair with fundoplication.  She would like some time to think about the options.  Of note we may consider getting an MRI to differentiate between the duplication cyst versus a leiomyoma to help her make a decision.  She will contact us with a plan.       I  spent 40 minutes with the patient face to face counseling and coordination of care.    [name] 04/20/2021 2:37 PM

## 2021-04-23 ENCOUNTER — Encounter: Payer: Self-pay | Admitting: *Deleted

## 2021-04-23 ENCOUNTER — Encounter: Payer: Self-pay | Admitting: Gastroenterology

## 2021-04-23 ENCOUNTER — Other Ambulatory Visit: Payer: Self-pay | Admitting: *Deleted

## 2021-04-23 DIAGNOSIS — K2289 Other specified disease of esophagus: Secondary | ICD-10-CM

## 2021-04-23 DIAGNOSIS — K449 Diaphragmatic hernia without obstruction or gangrene: Secondary | ICD-10-CM

## 2021-05-01 ENCOUNTER — Encounter: Payer: Self-pay | Admitting: *Deleted

## 2021-05-07 NOTE — Progress Notes (Signed)
Surgical Instructions    Your procedure is scheduled on Thursday, August 18th, 2022.   Report to [facility] Main Entrance "A" at 05:30 A.M., then check in with the Admitting office.  Call this number if you have problems the morning of surgery:  [phone]   If you have any questions prior to your surgery date call [phone]: Open Monday-Friday 8am-4pm    Remember:  Do not eat or drink after midnight the night before your surgery   Take these medicines the morning of surgery with A SIP OF WATER:  escitalopram (LEXAPRO)  omeprazole (PRILOSEC)  SUMAtriptan (IMITREX) - if needed valACYclovir (VALTREX) - if needed  As of today, STOP taking any Aspirin (unless otherwise instructed by your surgeon) Aleve, Naproxen, Ibuprofen, Motrin, Advil, Goody's, BC's, all herbal medications, fish oil, and all vitamins.          Do not wear jewelry or makeup Do not wear lotions, powders, perfumes, or deodorant. Do not shave 48 hours prior to surgery.   Do not bring valuables to the hospital. DO Not wear nail polish, gel polish, artificial nails, or any other type of covering on natural nails including finger and toenails. If patients have artificial nails, gel coating, etc. that need to be removed by a nail salon please have this removed prior to surgery or surgery may need to be canceled/delayed if the surgeon/ anesthesia feels like the patient is unable to be adequately monitored.             [facility] is not responsible for any belongings or valuables.  Do NOT Smoke (Tobacco/Vaping) or drink Alcohol 24 hours prior to your procedure If you use a CPAP at night, you may bring all equipment for your overnight stay.   Contacts, glasses, dentures or bridgework may not be worn into surgery, please bring cases for these belongings   For patients admitted to the hospital, discharge time will be determined by your treatment team.   Patients discharged the day of surgery will not be allowed to  drive home, and someone needs to stay with them for 24 hours.  ONLY 1 SUPPORT PERSON MAY BE PRESENT WHILE YOU ARE IN SURGERY. IF YOU ARE TO BE ADMITTED ONCE YOU ARE IN YOUR ROOM YOU WILL BE ALLOWED TWO (2) VISITORS.  Minor children may have two parents present. Special consideration for safety and communication needs will be reviewed on a case by case basis.  Special instructions:    Oral Hygiene is also important to reduce your risk of infection.  Remember - BRUSH YOUR TEETH THE MORNING OF SURGERY WITH YOUR REGULAR TOOTHPASTE   [facility]- Preparing For Surgery  Before surgery, you can play an important role. Because skin is not sterile, your skin needs to be as free of germs as possible. You can reduce the number of germs on your skin by washing with CHG (chlorahexidine gluconate) Soap before surgery.  CHG is an antiseptic cleaner which kills germs and bonds with the skin to continue killing germs even after washing.     Please do not use if you have an allergy to CHG or antibacterial soaps. If your skin becomes reddened/irritated stop using the CHG.  Do not shave (including legs and underarms) for at least 48 hours prior to first CHG shower. It is OK to shave your face.  Please follow these instructions carefully.     Shower the NIGHT BEFORE SURGERY and the MORNING OF SURGERY with CHG Soap.   If you  chose to wash your hair, wash your hair first as usual with your normal shampoo. After you shampoo, rinse your hair and body thoroughly to remove the shampoo.  Then [employer] and genitals (private parts) with your normal soap and rinse thoroughly to remove soap.  After that Use CHG Soap as you would any other liquid soap. You can apply CHG directly to the skin and wash gently with a scrungie or a clean washcloth.   Apply the CHG Soap to your body ONLY FROM THE NECK DOWN.  Do not use on open wounds or open sores. Avoid contact with your eyes, ears, mouth and genitals (private parts). Wash Face  and genitals (private parts)  with your normal soap.   Wash thoroughly, paying special attention to the area where your surgery will be performed.  Thoroughly rinse your body with warm water from the neck down.  DO NOT shower/wash with your normal soap after using and rinsing off the CHG Soap.  Pat yourself dry with a CLEAN TOWEL.  Wear CLEAN PAJAMAS to bed the night before surgery  Place CLEAN SHEETS on your bed the night before your surgery  DO NOT SLEEP WITH PETS.   Day of Surgery:  Take a shower with CHG soap. Wear Clean/Comfortable clothing the morning of surgery Do not apply any deodorants/lotions.   Remember to brush your teeth WITH YOUR REGULAR TOOTHPASTE.   Please read over the following fact sheets that you were given.

## 2021-05-08 ENCOUNTER — Other Ambulatory Visit: Payer: Self-pay

## 2021-05-08 ENCOUNTER — Encounter (HOSPITAL_COMMUNITY)
Admission: RE | Admit: 2021-05-08 | Discharge: 2021-05-08 | Disposition: A | Discharge status: Home / Self Care | Payer: BC Managed Care – PPO | Source: Ambulatory Visit | Attending: Thoracic Surgery (Cardiothoracic Vascular Surgery) | Admitting: Thoracic Surgery (Cardiothoracic Vascular Surgery)

## 2021-05-08 ENCOUNTER — Encounter (HOSPITAL_COMMUNITY): Payer: Self-pay

## 2021-05-08 ENCOUNTER — Ambulatory Visit (HOSPITAL_COMMUNITY)
Admission: RE | Admit: 2021-05-08 | Discharge: 2021-05-08 | Disposition: A | Discharge status: Home / Self Care | Payer: BC Managed Care – PPO | Source: Ambulatory Visit | Attending: Thoracic Surgery (Cardiothoracic Vascular Surgery) | Admitting: Thoracic Surgery (Cardiothoracic Vascular Surgery)

## 2021-05-08 DIAGNOSIS — K2289 Other specified disease of esophagus: Secondary | ICD-10-CM

## 2021-05-08 DIAGNOSIS — Z01818 Encounter for other preprocedural examination: Secondary | ICD-10-CM

## 2021-05-08 DIAGNOSIS — K449 Diaphragmatic hernia without obstruction or gangrene: Secondary | ICD-10-CM

## 2021-05-08 DIAGNOSIS — Z20822 Contact with and (suspected) exposure to covid-19: Secondary | ICD-10-CM | POA: Insufficient documentation

## 2021-05-08 HISTORY — DX: Personal history of other diseases of the digestive system: Z87.19

## 2021-05-08 HISTORY — DX: Other specified postprocedural states: R11.2

## 2021-05-08 HISTORY — DX: Other complications of anesthesia, initial encounter: T88.59XA

## 2021-05-08 HISTORY — DX: Other specified postprocedural states: Z98.890

## 2021-05-08 LAB — COMPREHENSIVE METABOLIC PANEL
ALT: 20 U/L (ref 0–44)
AST: 24 U/L (ref 15–41)
Albumin: 3.6 g/dL (ref 3.5–5.0)
Alkaline Phosphatase: 54 U/L (ref 38–126)
Anion gap: 9 (ref 5–15)
BUN: 15 mg/dL (ref 6–20)
CO2: 24 mmol/L (ref 22–32)
Calcium: 9.1 mg/dL (ref 8.9–10.3)
Chloride: 105 mmol/L (ref 98–111)
Creatinine, Ser: 0.81 mg/dL (ref 0.44–1.00)
GFR, Estimated: >60 mL/min (ref >60)
Glucose, Bld: 84 mg/dL (ref 70–99)
Potassium: 3.8 mmol/L (ref 3.5–5.1)
Sodium: 138 mmol/L (ref 135–145)
Total Bilirubin: 0.1 mg/dL — ABNORMAL LOW (ref 0.3–1.2)
Total Protein: 6 g/dL — ABNORMAL LOW (ref 6.5–8.1)

## 2021-05-08 LAB — SARS CORONAVIRUS 2 (TAT 6-24 HRS): SARS Coronavirus 2: NEGATIVE

## 2021-05-08 LAB — SURGICAL PCR SCREEN
MRSA, PCR: NEGATIVE
Staphylococcus aureus: NEGATIVE

## 2021-05-08 LAB — CBC
HCT: 42 % (ref 36.0–46.0)
Hemoglobin: 13.5 g/dL (ref 12.0–15.0)
MCH: 30.8 pg (ref 26.0–34.0)
MCHC: 32.1 g/dL (ref 30.0–36.0)
MCV: 95.7 fL (ref 80.0–100.0)
Platelets: 215 10*3/uL (ref 150–400)
RBC: 4.39 MIL/uL (ref 3.87–5.11)
RDW: 12.7 % (ref 11.5–15.5)
WBC: 4.7 10*3/uL (ref 4.0–10.5)
nRBC: 0 % (ref 0.0–0.2)

## 2021-05-08 LAB — URINALYSIS, ROUTINE W REFLEX MICROSCOPIC
Bilirubin Urine: NEGATIVE
Glucose, UA: NEGATIVE mg/dL
Ketones, ur: NEGATIVE mg/dL
Nitrite: NEGATIVE
Protein, ur: NEGATIVE mg/dL
Specific Gravity, Urine: 1.019 (ref 1.005–1.030)
pH: 5 (ref 5.0–8.0)

## 2021-05-08 LAB — TYPE AND SCREEN
ABO/RH(D): B POS
Antibody Screen: NEGATIVE

## 2021-05-08 LAB — APTT: aPTT: 28 seconds (ref 24–36)

## 2021-05-08 LAB — PROTIME-INR
INR: 1 (ref 0.8–1.2)
Prothrombin Time: 12.8 seconds (ref 11.4–15.2)

## 2021-05-08 NOTE — Progress Notes (Signed)
PCP - Harlan Stains, MD Cardiologist - denies  PPM/ICD - denies Device Orders - N/A Rep Notified - N/A  Chest x-ray - 05/08/2021 EKG - 05/08/2021 Stress Test - denies ECHO - denies Cardiac Cath - denies  Sleep Study - denies CPAP - N/A  Fasting Blood Sugar - N/A  Blood Thinner Instructions: N/A  Aspirin Instructions: Patient was instructed: As of today, STOP taking any Aspirin (unless otherwise instructed by your surgeon) Aleve, Naproxen, Ibuprofen, Motrin, Advil, Goody's, BC's, all herbal medications, fish oil, and all vitamins.  ERAS Protcol - No  COVID TEST- 05/08/2021 in PAT   Anesthesia review: yes, anesthesia complications (nausea/vomiting)  Patient denies shortness of breath, fever, cough and chest pain at PAT appointment   All instructions explained to the patient, with a verbal understanding of the material. Patient agrees to go over the instructions while at home for a better understanding. Patient also instructed to self quarantine after being tested for COVID-19. The opportunity to ask questions was provided.

## 2021-05-08 NOTE — Progress Notes (Signed)
Abnormal lab in PAT - UA: Leukocytes, UA - trace; Bacteria, UA - rare, Hgb urine dipstick - small. Dr. Kipp Brood office was notified Levonne Spiller - surgical scheduler).

## 2021-05-09 NOTE — Anesthesia Preprocedure Evaluation (Addendum)
Anesthesia Evaluation  Patient identified by MRN, date of birth, ID band Patient awake    Reviewed: Allergy & Precautions, NPO status , Patient's Chart, lab work & pertinent test results  History of Anesthesia Complications (+) PONV  Airway Mallampati: II  TM Distance: >3 FB Neck ROM: Full    Dental  (+) Teeth Intact   Pulmonary neg pulmonary ROS,    Pulmonary exam normal        Cardiovascular negative cardio ROS   Rhythm:Regular Rate:Normal     Neuro/Psych  Headaches, Anxiety Depression    GI/Hepatic Neg liver ROS, hiatal hernia, GERD  ,Esophageal mass   Endo/Other  negative endocrine ROS  Renal/GU negative Renal ROS  negative genitourinary   Musculoskeletal  (+) Arthritis ,   Abdominal (+)  Abdomen: soft. Bowel sounds: normal.  Peds  Hematology negative hematology ROS (+)   Anesthesia Other Findings   Reproductive/Obstetrics                            Anesthesia Physical Anesthesia Plan  ASA: 3  Anesthesia Plan: General   Post-op Pain Management:    Induction: Intravenous  PONV Risk Score and Plan: 4 or greater and Ondansetron, Dexamethasone, Aprepitant, Midazolam, Scopolamine patch - Pre-op and Treatment may vary due to age or medical condition  Airway Management Planned: Mask and Double Lumen EBT  Additional Equipment: Arterial line and CVP  Intra-op Plan:   Post-operative Plan: Extubation in OR  Informed Consent: I have reviewed the patients History and Physical, chart, labs and discussed the procedure including the risks, benefits and alternatives for the proposed anesthesia with the patient or authorized representative who has indicated his/her understanding and acceptance.     Dental advisory given  Plan Discussed with: CRNA  Anesthesia Plan Comments: (Lab Results      Component                Value               Date                      WBC                       4.7                 05/08/2021                HGB                      13.5                05/08/2021                HCT                      42.0                05/08/2021                MCV                      95.7                05/08/2021                PLT  215                 05/08/2021           Lab Results      Component                Value               Date                      NA                       138                 05/08/2021                K                        3.8                 05/08/2021                CO2                      24                  05/08/2021                GLUCOSE                  84                  05/08/2021                BUN                      15                  05/08/2021                CREATININE               0.81                05/08/2021                CALCIUM                  9.1                 05/08/2021                GFRNONAA                 >60                 05/08/2021          )       Anesthesia Quick Evaluation

## 2021-05-10 ENCOUNTER — Other Ambulatory Visit: Payer: Self-pay

## 2021-05-10 ENCOUNTER — Inpatient Hospital Stay (HOSPITAL_COMMUNITY): Payer: BC Managed Care – PPO | Admitting: Anesthesiology

## 2021-05-10 ENCOUNTER — Encounter: Payer: Self-pay | Admitting: Gastroenterology

## 2021-05-10 ENCOUNTER — Observation Stay (HOSPITAL_COMMUNITY): Payer: BC Managed Care – PPO

## 2021-05-10 ENCOUNTER — Inpatient Hospital Stay (HOSPITAL_COMMUNITY)
Admission: RE | Admit: 2021-05-10 | Discharge: 2021-05-14 | DRG: 328 | Disposition: A | Discharge status: Home / Self Care | Payer: BC Managed Care – PPO | Attending: Thoracic Surgery (Cardiothoracic Vascular Surgery) | Admitting: Thoracic Surgery (Cardiothoracic Vascular Surgery)

## 2021-05-10 ENCOUNTER — Encounter (HOSPITAL_COMMUNITY): Payer: Self-pay | Admitting: Thoracic Surgery (Cardiothoracic Vascular Surgery)

## 2021-05-10 ENCOUNTER — Encounter (HOSPITAL_COMMUNITY)
Admission: RE | Disposition: A | Discharge status: Home / Self Care | Payer: Self-pay | Source: Home / Self Care | Attending: Thoracic Surgery (Cardiothoracic Vascular Surgery)

## 2021-05-10 ENCOUNTER — Inpatient Hospital Stay (HOSPITAL_COMMUNITY): Payer: BC Managed Care – PPO | Admitting: Physician Assistant

## 2021-05-10 DIAGNOSIS — D130 Benign neoplasm of esophagus: Secondary | ICD-10-CM | POA: Diagnosis present

## 2021-05-10 DIAGNOSIS — R1310 Dysphagia, unspecified: Secondary | ICD-10-CM | POA: Diagnosis present

## 2021-05-10 DIAGNOSIS — Z885 Allergy status to narcotic agent status: Secondary | ICD-10-CM

## 2021-05-10 DIAGNOSIS — Z79899 Other long term (current) drug therapy: Secondary | ICD-10-CM

## 2021-05-10 DIAGNOSIS — K229 Disease of esophagus, unspecified: Secondary | ICD-10-CM

## 2021-05-10 DIAGNOSIS — K2289 Other specified disease of esophagus: Secondary | ICD-10-CM

## 2021-05-10 DIAGNOSIS — Z9889 Other specified postprocedural states: Secondary | ICD-10-CM

## 2021-05-10 DIAGNOSIS — M19042 Primary osteoarthritis, left hand: Secondary | ICD-10-CM | POA: Diagnosis present

## 2021-05-10 DIAGNOSIS — Z87442 Personal history of urinary calculi: Secondary | ICD-10-CM

## 2021-05-10 DIAGNOSIS — Z83511 Family history of glaucoma: Secondary | ICD-10-CM

## 2021-05-10 DIAGNOSIS — K219 Gastro-esophageal reflux disease without esophagitis: Secondary | ICD-10-CM | POA: Diagnosis present

## 2021-05-10 DIAGNOSIS — M19041 Primary osteoarthritis, right hand: Secondary | ICD-10-CM | POA: Diagnosis present

## 2021-05-10 DIAGNOSIS — K449 Diaphragmatic hernia without obstruction or gangrene: Principal | ICD-10-CM | POA: Diagnosis present

## 2021-05-10 DIAGNOSIS — Z20822 Contact with and (suspected) exposure to covid-19: Secondary | ICD-10-CM | POA: Diagnosis present

## 2021-05-10 DIAGNOSIS — Z8719 Personal history of other diseases of the digestive system: Secondary | ICD-10-CM

## 2021-05-10 DIAGNOSIS — F419 Anxiety disorder, unspecified: Secondary | ICD-10-CM | POA: Diagnosis present

## 2021-05-10 DIAGNOSIS — G43909 Migraine, unspecified, not intractable, without status migrainosus: Secondary | ICD-10-CM | POA: Diagnosis present

## 2021-05-10 HISTORY — PX: XI ROBOTIC ASSISTED HIATAL HERNIA REPAIR: SHX6889

## 2021-05-10 HISTORY — PX: ESOPHAGOGASTRODUODENOSCOPY: SHX5428

## 2021-05-10 LAB — BLOOD GAS, ARTERIAL
Acid-base deficit: 3.3 mmol/L — ABNORMAL HIGH (ref 0.0–2.0)
Bicarbonate: 21.5 mmol/L (ref 20.0–28.0)
FIO2: 21
O2 Saturation: 97.4 %
Patient temperature: 37
pCO2 arterial: 40.4 mmHg (ref 32.0–48.0)
pH, Arterial: 7.346 — ABNORMAL LOW (ref 7.350–7.450)
pO2, Arterial: 115 mmHg — ABNORMAL HIGH (ref 83.0–108.0)

## 2021-05-10 LAB — POCT PREGNANCY, URINE: Preg Test, Ur: NEGATIVE

## 2021-05-10 SURGERY — EXCISION, MASS, MEDIASTINUM, ROBOT-ASSISTED
Anesthesia: General | Site: Chest | Laterality: Right

## 2021-05-10 MED ORDER — ROCURONIUM BROMIDE 10 MG/ML (PF) SYRINGE
PREFILLED_SYRINGE | INTRAVENOUS | Status: DC | PRN
  Administered 2021-05-10 (×3): 100 mg via INTRAVENOUS

## 2021-05-10 MED ORDER — BSS IO SOLN
15.0000 mL | Freq: Once | INTRAOCULAR | Status: DC
  Filled 2021-05-10: qty 15

## 2021-05-10 MED ORDER — CHLORHEXIDINE GLUCONATE 0.12 % MT SOLN
OROMUCOSAL | Status: AC
  Administered 2021-05-10: 15 mL via OROMUCOSAL
  Filled 2021-05-10: qty 15

## 2021-05-10 MED ORDER — SODIUM CHLORIDE 0.45 % IV SOLN: INTRAVENOUS | Status: DC

## 2021-05-10 MED ORDER — SCOPOLAMINE 1 MG/3DAYS TD PT72
MEDICATED_PATCH | TRANSDERMAL | Status: AC
  Administered 2021-05-10: 1.5 mg via TRANSDERMAL
  Filled 2021-05-10: qty 1

## 2021-05-10 MED ORDER — ACETAMINOPHEN 10 MG/ML IV SOLN
INTRAVENOUS | Status: AC
  Filled 2021-05-10: qty 100

## 2021-05-10 MED ORDER — ROCURONIUM BROMIDE 10 MG/ML (PF) SYRINGE
PREFILLED_SYRINGE | INTRAVENOUS | Status: AC
  Filled 2021-05-10: qty 30

## 2021-05-10 MED ORDER — BUPIVACAINE LIPOSOME 1.3 % IJ SUSP
INTRAMUSCULAR | Status: AC
  Filled 2021-05-10: qty 20

## 2021-05-10 MED ORDER — ENOXAPARIN SODIUM 40 MG/0.4ML IJ SOSY
40.0000 mg | PREFILLED_SYRINGE | INTRAMUSCULAR | Status: DC
  Administered 2021-05-11 – 2021-05-14 (×4): 40 mg via SUBCUTANEOUS
  Filled 2021-05-10 (×4): qty 0.4

## 2021-05-10 MED ORDER — BUPIVACAINE HCL (PF) 0.25 % IJ SOLN
INTRAMUSCULAR | Status: AC
  Filled 2021-05-10: qty 30

## 2021-05-10 MED ORDER — PHENYLEPHRINE HCL (PRESSORS) 10 MG/ML IV SOLN
INTRAVENOUS | Status: DC | PRN
  Administered 2021-05-10 (×2): 160 ug via INTRAVENOUS
  Administered 2021-05-10: 80 ug via INTRAVENOUS

## 2021-05-10 MED ORDER — CHLORHEXIDINE GLUCONATE CLOTH 2 % EX PADS
6.0000 | MEDICATED_PAD | Freq: Every day | CUTANEOUS | Status: DC
  Administered 2021-05-11 – 2021-05-12 (×2): 6 via TOPICAL

## 2021-05-10 MED ORDER — FENTANYL CITRATE (PF) 250 MCG/5ML IJ SOLN
INTRAMUSCULAR | Status: AC
  Filled 2021-05-10: qty 5

## 2021-05-10 MED ORDER — MIDAZOLAM HCL 2 MG/2ML IJ SOLN
INTRAMUSCULAR | Status: DC | PRN
  Administered 2021-05-10: 2 mg via INTRAVENOUS

## 2021-05-10 MED ORDER — SCOPOLAMINE 1 MG/3DAYS TD PT72: 1.0000 | MEDICATED_PATCH | TRANSDERMAL | Status: DC

## 2021-05-10 MED ORDER — KETOROLAC TROMETHAMINE 0.5 % OP SOLN
1.0000 [drp] | Freq: Three times a day (TID) | OPHTHALMIC | Status: AC | PRN
  Administered 2021-05-11: 1 [drp] via OPHTHALMIC
  Filled 2021-05-10: qty 5

## 2021-05-10 MED ORDER — SUGAMMADEX SODIUM 200 MG/2ML IV SOLN
INTRAVENOUS | Status: DC | PRN
  Administered 2021-05-10: 200 mg via INTRAVENOUS

## 2021-05-10 MED ORDER — PHENYLEPHRINE HCL-NACL 20-0.9 MG/250ML-% IV SOLN
INTRAVENOUS | Status: DC | PRN
  Administered 2021-05-10: 20 ug/min via INTRAVENOUS

## 2021-05-10 MED ORDER — FENTANYL CITRATE (PF) 100 MCG/2ML IJ SOLN
12.5000 ug | INTRAMUSCULAR | Status: DC | PRN
  Administered 2021-05-11 (×2): 12.5 ug via INTRAVENOUS
  Administered 2021-05-12 (×2): 25 ug via INTRAVENOUS
  Administered 2021-05-12 (×2): 12.5 ug via INTRAVENOUS
  Administered 2021-05-12 – 2021-05-14 (×4): 25 ug via INTRAVENOUS
  Filled 2021-05-10 (×10): qty 2

## 2021-05-10 MED ORDER — MIDAZOLAM HCL 2 MG/2ML IJ SOLN
INTRAMUSCULAR | Status: AC
  Filled 2021-05-10: qty 2

## 2021-05-10 MED ORDER — LACTATED RINGERS IV SOLN: INTRAVENOUS | Status: DC

## 2021-05-10 MED ORDER — CHLORHEXIDINE GLUCONATE 0.12 % MT SOLN: 15.0000 mL | Freq: Once | OROMUCOSAL | Status: AC

## 2021-05-10 MED ORDER — ACETAMINOPHEN 10 MG/ML IV SOLN
1000.0000 mg | Freq: Once | INTRAVENOUS | Status: DC | PRN
  Administered 2021-05-10: 1000 mg via INTRAVENOUS

## 2021-05-10 MED ORDER — HYDROMORPHONE HCL 1 MG/ML IJ SOLN
INTRAMUSCULAR | Status: DC | PRN
  Administered 2021-05-10: .5 mg via INTRAVENOUS

## 2021-05-10 MED ORDER — DEXAMETHASONE SODIUM PHOSPHATE 10 MG/ML IJ SOLN
INTRAMUSCULAR | Status: DC | PRN
  Administered 2021-05-10: 10 mg via INTRAVENOUS

## 2021-05-10 MED ORDER — HYDROMORPHONE HCL 1 MG/ML IJ SOLN
INTRAMUSCULAR | Status: AC
  Filled 2021-05-10: qty 0.5

## 2021-05-10 MED ORDER — APREPITANT 40 MG PO CAPS: 40.0000 mg | ORAL_CAPSULE | Freq: Once | ORAL | Status: AC

## 2021-05-10 MED ORDER — ONDANSETRON HCL 4 MG/2ML IJ SOLN
INTRAMUSCULAR | Status: AC
  Filled 2021-05-10: qty 2

## 2021-05-10 MED ORDER — BSS IO SOLN
INTRAOCULAR | Status: AC
  Filled 2021-05-10: qty 15

## 2021-05-10 MED ORDER — KETOROLAC TROMETHAMINE 0.5 % OP SOLN
OPHTHALMIC | Status: AC
  Filled 2021-05-10: qty 5

## 2021-05-10 MED ORDER — APREPITANT 40 MG PO CAPS
ORAL_CAPSULE | ORAL | Status: AC
  Administered 2021-05-10: 40 mg via ORAL
  Filled 2021-05-10: qty 1

## 2021-05-10 MED ORDER — POLYMYXIN B-TRIMETHOPRIM 10000-0.1 UNIT/ML-% OP SOLN
1.0000 [drp] | Freq: Three times a day (TID) | OPHTHALMIC | Status: AC
  Administered 2021-05-11 (×2): 1 [drp] via OPHTHALMIC
  Filled 2021-05-10 (×2): qty 10

## 2021-05-10 MED ORDER — BUPIVACAINE HCL (PF) 0.25 % IJ SOLN
INTRAMUSCULAR | Status: DC | PRN
  Administered 2021-05-10: 30 mL

## 2021-05-10 MED ORDER — CEFAZOLIN SODIUM-DEXTROSE 2-4 GM/100ML-% IV SOLN
INTRAVENOUS | Status: AC
  Filled 2021-05-10: qty 100

## 2021-05-10 MED ORDER — FENTANYL CITRATE (PF) 100 MCG/2ML IJ SOLN: 25.0000 ug | INTRAMUSCULAR | Status: DC | PRN

## 2021-05-10 MED ORDER — DEXAMETHASONE SODIUM PHOSPHATE 10 MG/ML IJ SOLN
INTRAMUSCULAR | Status: AC
  Filled 2021-05-10: qty 1

## 2021-05-10 MED ORDER — FENTANYL CITRATE (PF) 250 MCG/5ML IJ SOLN
INTRAMUSCULAR | Status: DC | PRN
  Administered 2021-05-10 (×3): 50 ug via INTRAVENOUS
  Administered 2021-05-10: 100 ug via INTRAVENOUS
  Administered 2021-05-10 (×5): 50 ug via INTRAVENOUS

## 2021-05-10 MED ORDER — KETOROLAC TROMETHAMINE 30 MG/ML IJ SOLN
30.0000 mg | Freq: Four times a day (QID) | INTRAMUSCULAR | Status: DC | PRN
  Administered 2021-05-10 – 2021-05-13 (×6): 30 mg via INTRAVENOUS
  Filled 2021-05-10 (×7): qty 1

## 2021-05-10 MED ORDER — BUPIVACAINE HCL (PF) 0.5 % IJ SOLN
INTRAMUSCULAR | Status: AC
  Filled 2021-05-10: qty 30

## 2021-05-10 MED ORDER — ALBUMIN HUMAN 25 % IV SOLN: INTRAVENOUS | Status: DC | PRN

## 2021-05-10 MED ORDER — ORAL CARE MOUTH RINSE: 15.0000 mL | Freq: Once | OROMUCOSAL | Status: AC

## 2021-05-10 MED ORDER — LIDOCAINE 2% (20 MG/ML) 5 ML SYRINGE
INTRAMUSCULAR | Status: DC | PRN
  Administered 2021-05-10: 60 mg via INTRAVENOUS

## 2021-05-10 MED ORDER — ONDANSETRON HCL 4 MG/2ML IJ SOLN
4.0000 mg | Freq: Four times a day (QID) | INTRAMUSCULAR | Status: DC
  Administered 2021-05-10 – 2021-05-14 (×14): 4 mg via INTRAVENOUS
  Filled 2021-05-10 (×14): qty 2

## 2021-05-10 MED ORDER — 0.9 % SODIUM CHLORIDE (POUR BTL) OPTIME
TOPICAL | Status: DC | PRN
  Administered 2021-05-10: 2000 mL

## 2021-05-10 MED ORDER — CEFAZOLIN SODIUM-DEXTROSE 2-4 GM/100ML-% IV SOLN
2.0000 g | Freq: Three times a day (TID) | INTRAVENOUS | Status: AC
  Administered 2021-05-10: 2 g via INTRAVENOUS
  Filled 2021-05-10: qty 100

## 2021-05-10 MED ORDER — PROPOFOL 10 MG/ML IV BOLUS
INTRAVENOUS | Status: AC
  Filled 2021-05-10: qty 20

## 2021-05-10 MED ORDER — ONDANSETRON HCL 4 MG/2ML IJ SOLN
INTRAMUSCULAR | Status: DC | PRN
  Administered 2021-05-10: 4 mg via INTRAVENOUS

## 2021-05-10 MED ORDER — SODIUM CHLORIDE FLUSH 0.9 % IV SOLN
INTRAVENOUS | Status: DC | PRN
  Administered 2021-05-10: 100 mL

## 2021-05-10 MED ORDER — CEFAZOLIN SODIUM-DEXTROSE 2-4 GM/100ML-% IV SOLN
2.0000 g | INTRAVENOUS | Status: AC
  Administered 2021-05-10 (×2): 2 g via INTRAVENOUS

## 2021-05-10 MED ORDER — PROMETHAZINE HCL 25 MG/ML IJ SOLN: 6.2500 mg | INTRAMUSCULAR | Status: DC | PRN

## 2021-05-10 MED ORDER — PROPOFOL 10 MG/ML IV BOLUS
INTRAVENOUS | Status: DC | PRN
  Administered 2021-05-10: 130 mg via INTRAVENOUS

## 2021-05-10 SURGICAL SUPPLY — 115 items
ADH SKN CLS APL DERMABOND .7 (GAUZE/BANDAGES/DRESSINGS) ×9
APL PRP STRL LF DISP 70% ISPRP (MISCELLANEOUS) ×6
BAG SPEC RTRVL C125 8X14 (MISCELLANEOUS) ×3
BLADE STERNUM SYSTEM 6 (BLADE) IMPLANT
BLADE SURG 11 STRL SS (BLADE) ×4 IMPLANT
CANISTER SUCT 3000ML PPV (MISCELLANEOUS) ×8 IMPLANT
CANNULA REDUC XI 12-8 STAPL (CANNULA) ×4
CANNULA REDUCER 12-8 DVNC XI (CANNULA) ×3 IMPLANT
CATH THORACIC 28FR (CATHETERS) ×4 IMPLANT
CHLORAPREP W/TINT 26 (MISCELLANEOUS) ×5 IMPLANT
CNTNR URN SCR LID CUP LEK RST (MISCELLANEOUS) IMPLANT
CONNECTOR BLAKE 2:1 CARIO BLK (MISCELLANEOUS) IMPLANT
CONT SPEC 4OZ STRL OR WHT (MISCELLANEOUS) ×12
COVER SURGICAL LIGHT HANDLE (MISCELLANEOUS) ×1 IMPLANT
DEFOGGER SCOPE WARMER CLEARIFY (MISCELLANEOUS) ×4 IMPLANT
DERMABOND ADVANCED (GAUZE/BANDAGES/DRESSINGS) ×3
DERMABOND ADVANCED .7 DNX12 (GAUZE/BANDAGES/DRESSINGS) ×6 IMPLANT
DEVICE SUTURE ENDOST 10MM (ENDOMECHANICALS) IMPLANT
DRAIN CHANNEL 19F RND (DRAIN) ×1 IMPLANT
DRAIN CONNECTOR BLAKE 1:1 (MISCELLANEOUS) ×1 IMPLANT
DRAIN PENROSE 1/4X12 LTX STRL (WOUND CARE) ×4 IMPLANT
DRAPE ARM DVNC X/XI (DISPOSABLE) ×12 IMPLANT
DRAPE CARDIOVASC SPLIT 88X140 (DRAPES) IMPLANT
DRAPE CARDIOVASCULAR INCISE (DRAPES)
DRAPE COLUMN DVNC XI (DISPOSABLE) ×12 IMPLANT
DRAPE CV SPLIT W-CLR ANES SCRN (DRAPES) ×5 IMPLANT
DRAPE DA VINCI XI ARM (DISPOSABLE) ×16
DRAPE DA VINCI XI COLUMN (DISPOSABLE) ×4
DRAPE INCISE IOBAN 66X45 STRL (DRAPES) ×1 IMPLANT
DRAPE ORTHO SPLIT 77X108 STRL (DRAPES) ×8
DRAPE SRG 135X102X78XABS (DRAPES) IMPLANT
DRAPE SURG ORHT 6 SPLT 77X108 (DRAPES) ×3 IMPLANT
DRAPE WARM FLUID 44X44 (DRAPES) ×3 IMPLANT
DRSG AQUACEL AG ADV 3.5X14 (GAUZE/BANDAGES/DRESSINGS) IMPLANT
ELECT REM PT RETURN 9FT ADLT (ELECTROSURGICAL) ×4
ELECTRODE REM PT RTRN 9FT ADLT (ELECTROSURGICAL) ×3 IMPLANT
EVACUATOR SILICONE 100CC (DRAIN) ×1 IMPLANT
FELT TEFLON 1X6 (MISCELLANEOUS) IMPLANT
GAUZE KITTNER 4X5 RF (MISCELLANEOUS) ×1 IMPLANT
GAUZE SPONGE 4X4 12PLY STRL (GAUZE/BANDAGES/DRESSINGS) ×2 IMPLANT
GLOVE SURG ENC MOIS LTX SZ7 (GLOVE) ×8 IMPLANT
GLOVE SURG ENC MOIS LTX SZ7.5 (GLOVE) ×4 IMPLANT
GOWN STRL REUS W/ TWL LRG LVL3 (GOWN DISPOSABLE) ×3 IMPLANT
GOWN STRL REUS W/ TWL XL LVL3 (GOWN DISPOSABLE) ×6 IMPLANT
GOWN STRL REUS W/TWL 2XL LVL3 (GOWN DISPOSABLE) ×4 IMPLANT
GOWN STRL REUS W/TWL LRG LVL3 (GOWN DISPOSABLE) ×8
GOWN STRL REUS W/TWL XL LVL3 (GOWN DISPOSABLE) ×12
GRASPER SUT TROCAR 14GX15 (MISCELLANEOUS) IMPLANT
HEMOSTAT POWDER SURGIFOAM 1G (HEMOSTASIS) IMPLANT
HEMOSTAT SURGICEL 2X14 (HEMOSTASIS) ×1 IMPLANT
IRRIGATION STRYKERFLOW (MISCELLANEOUS) ×3 IMPLANT
IRRIGATOR STRYKERFLOW (MISCELLANEOUS)
IRRIGATOR SUCT 8 DISP DVNC XI (IRRIGATION / IRRIGATOR) IMPLANT
IRRIGATOR SUCTION 8MM XI DISP (IRRIGATION / IRRIGATOR) ×4
IV NS 1000ML (IV SOLUTION)
IV NS 1000ML BAXH (IV SOLUTION) IMPLANT
KIT BASIN OR (CUSTOM PROCEDURE TRAY) ×4 IMPLANT
KIT SUCTION CATH 14FR (SUCTIONS) IMPLANT
KIT TURNOVER KIT B (KITS) ×4 IMPLANT
NDL 18GX1X1/2 (RX/OR ONLY) (NEEDLE) IMPLANT
NDL HYPO 25GX1X1/2 BEV (NEEDLE) IMPLANT
NEEDLE 18GX1X1/2 (RX/OR ONLY) (NEEDLE) IMPLANT
NEEDLE HYPO 25GX1X1/2 BEV (NEEDLE) IMPLANT
NS IRRIG 1000ML POUR BTL (IV SOLUTION) ×8 IMPLANT
OBTURATOR OPTICAL STANDARD 8MM (TROCAR) ×4
OBTURATOR OPTICAL STND 8 DVNC (TROCAR) ×3
OBTURATOR OPTICALSTD 8 DVNC (TROCAR) ×3 IMPLANT
PACK CHEST (CUSTOM PROCEDURE TRAY) ×4 IMPLANT
PAD ARMBOARD 7.5X6 YLW CONV (MISCELLANEOUS) ×8 IMPLANT
PAD ELECT DEFIB RADIOL ZOLL (MISCELLANEOUS) ×4 IMPLANT
SEAL CANN UNIV 5-8 DVNC XI (MISCELLANEOUS) ×12 IMPLANT
SEAL XI 5MM-8MM UNIVERSAL (MISCELLANEOUS) ×16
SEALER SYNCHRO 8 IS4000 DV (MISCELLANEOUS)
SEALER SYNCHRO 8 IS4000 DVNC (MISCELLANEOUS) IMPLANT
SET IRRIG TUBING LAPAROSCOPIC (IRRIGATION / IRRIGATOR) IMPLANT
SET TRI-LUMEN FLTR TB AIRSEAL (TUBING) ×4 IMPLANT
SET TUBE SMOKE EVAC HIGH FLOW (TUBING) ×4 IMPLANT
SHEET MEDIUM DRAPE 40X70 STRL (DRAPES) ×4 IMPLANT
SOL ANTI FOG 6CC (MISCELLANEOUS) IMPLANT
SOLUTION ANTI FOG 6CC (MISCELLANEOUS)
SOLUTION ELECTROLUBE (MISCELLANEOUS) ×4 IMPLANT
STAPLER CANNULA SEAL DVNC XI (STAPLE) IMPLANT
STAPLER CANNULA SEAL XI (STAPLE)
STOPCOCK 4 WAY LG BORE MALE ST (IV SETS) ×4 IMPLANT
SUT BONE WAX W31G (SUTURE) IMPLANT
SUT ETHIBOND 0 36 GRN (SUTURE) ×8 IMPLANT
SUT MNCRL AB 3-0 PS2 18 (SUTURE) ×2 IMPLANT
SUT PDS AB 1 CTX 36 (SUTURE) IMPLANT
SUT SILK  1 MH (SUTURE) ×8
SUT SILK 1 MH (SUTURE) ×3 IMPLANT
SUT SILK 2 0 SH CR/8 (SUTURE) IMPLANT
SUT STEEL 6MS V (SUTURE) IMPLANT
SUT STEEL SZ 6 DBL 3X14 BALL (SUTURE) IMPLANT
SUT SURGIDAC NAB ES-9 0 48 120 (SUTURE) IMPLANT
SUT VIC AB 2-0 CT1 27 (SUTURE) ×4
SUT VIC AB 2-0 CT1 TAPERPNT 27 (SUTURE) IMPLANT
SUT VIC AB 2-0 CTX 36 (SUTURE) IMPLANT
SUT VIC AB 3-0 SH 27 (SUTURE) ×12
SUT VIC AB 3-0 SH 27X BRD (SUTURE) IMPLANT
SUT VIC AB 3-0 X1 27 (SUTURE) ×6 IMPLANT
SUT VICRYL 0 UR6 27IN ABS (SUTURE) ×7 IMPLANT
SYR 10ML LL (SYRINGE) ×4 IMPLANT
SYR 50ML LL SCALE MARK (SYRINGE) ×4 IMPLANT
SYSTEM RETRIEVAL ANCHOR 8 (MISCELLANEOUS) ×1 IMPLANT
SYSTEM SAHARA CHEST DRAIN ATS (WOUND CARE) ×4 IMPLANT
TAPE CLOTH 4X10 WHT NS (GAUZE/BANDAGES/DRESSINGS) ×2 IMPLANT
TOWEL GREEN STERILE (TOWEL DISPOSABLE) ×4 IMPLANT
TOWEL GREEN STERILE FF (TOWEL DISPOSABLE) ×4 IMPLANT
TRAY FOLEY MTR SLVR 16FR STAT (SET/KITS/TRAYS/PACK) ×4 IMPLANT
TRAY FOLEY SLVR 16FR TEMP STAT (SET/KITS/TRAYS/PACK) IMPLANT
TROCAR XCEL 12X100 BLDLESS (ENDOMECHANICALS) ×3 IMPLANT
TROCAR XCEL BLADELESS 5X75MML (TROCAR) ×4 IMPLANT
TROCAR XCEL NON-BLD 5MMX100MML (ENDOMECHANICALS) IMPLANT
TUBING EXTENTION W/L.L. (IV SETS) ×4 IMPLANT
WATER STERILE IRR 1000ML POUR (IV SOLUTION) ×8 IMPLANT

## 2021-05-10 NOTE — Anesthesia Procedure Notes (Signed)
Procedure Name: Intubation Date/Time: 05/10/2021 8:02 AM Performed by: Lance Coon, CRNA Pre-anesthesia Checklist: Patient identified, Emergency Drugs available, Suction available, Patient being monitored and Timeout performed Patient Re-evaluated:Patient Re-evaluated prior to induction Oxygen Delivery Method: Circle system utilized Preoxygenation: Pre-oxygenation with 100% oxygen Induction Type: IV induction Ventilation: Mask ventilation without difficulty Laryngoscope Size: 3 and Miller Grade View: Grade I Endobronchial tube: Left, Double lumen EBT and EBT position confirmed by fiberoptic bronchoscope and 35 Fr Number of attempts: 1 Airway Equipment and Method: Stylet Placement Confirmation: ETT inserted through vocal cords under direct vision, positive ETCO2 and breath sounds checked- equal and bilateral Tube secured with: Tape Dental Injury: Teeth and Oropharynx as per pre-operative assessment

## 2021-05-10 NOTE — Brief Op Note (Signed)
05/10/2021  8:17 AM  PATIENT:  Kathleen Bird  45 y.o. female  PRE-OPERATIVE DIAGNOSIS:  Hiatal Hernia, Esophageal Mass  POST-OPERATIVE DIAGNOSIS:  Hiatal Hernia, Esophageal Mass  PROCEDURE:  Procedure(s):  RIGHT XI ROBOTIC ASSISTED THORASCOPY RESECTION OF ESOPHAGEAL MASS (Right) XI ROBOTIC ASSISTED LAPAROSCOPY HIATAL HERNIA REPAIR AND FUNDOPLICATION (N/A) ESOPHAGOGASTRODUODENOSCOPY (EGD) (N/A)  SURGEON:  Surgeon(s) and Role:    * Lightfoot, Lucile Crater, MD - Primary  PHYSICIAN ASSISTANT: Ellwood Handler PA-C  ASSISTANTS: none   ANESTHESIA:   general  EBL:  Per Anesthesia Record  BLOOD ADMINISTERED:none  DRAINS:  19 [place], [place] Chest tube right chest    LOCAL MEDICATIONS USED:  OTHER Exparel  SPECIMEN:  Source of Specimen:  Esophageal Mass  DISPOSITION OF SPECIMEN:  PATHOLOGY  COUNTS:  YES  TOURNIQUET:  * No tourniquets in log *  DICTATION: .Dragon Dictation  PLAN OF CARE: Admit to observation  PATIENT DISPOSITION:  PACU - hemodynamically stable.   Delay start of Pharmacological VTE agent (>24hrs) due to surgical blood loss or risk of bleeding: no

## 2021-05-10 NOTE — Plan of Care (Signed)
  Problem: Education: Goal: Knowledge of General Education information will improve Description Including pain rating scale, medication(s)/side effects and non-pharmacologic comfort measures Outcome: Progressing   

## 2021-05-10 NOTE — Anesthesia Procedure Notes (Signed)
Arterial Line Insertion Start/End8/18/2022 7:10 AM, 05/10/2021 7:20 AM Performed by: Lance Coon, CRNA, CRNA  Preanesthetic checklist: patient identified, IV checked, site marked, risks and benefits discussed, surgical consent, monitors and equipment checked, pre-op evaluation, timeout performed and anesthesia consent Lidocaine 1% used for infiltration and patient sedated Left, radial was placed Catheter size: 20 G Hand hygiene performed , maximum sterile barriers used  and Seldinger technique used  Attempts: 1 Procedure performed without using ultrasound guided technique. Following insertion, dressing applied and Biopatch. Post procedure assessment: normal and unchanged  Patient tolerated the procedure well with no immediate complications.

## 2021-05-10 NOTE — Anesthesia Procedure Notes (Signed)
Central Venous Catheter Insertion Performed by: Darral Dash, DO, anesthesiologist Start/End8/18/2022 7:15 AM, 05/10/2021 7:25 AM Patient location: Pre-op. Preanesthetic checklist: patient identified, IV checked, site marked, risks and benefits discussed, surgical consent, monitors and equipment checked, pre-op evaluation, timeout performed and anesthesia consent Position: Trendelenburg Lidocaine 1% used for infiltration and patient sedated Hand hygiene performed  and maximum sterile barriers used  Catheter size: 8 Fr Total catheter length 16. Central line was placed.Double lumen Procedure performed using ultrasound guided technique. Ultrasound Notes:anatomy identified, needle tip was noted to be adjacent to the nerve/plexus identified, no ultrasound evidence of intravascular and/or intraneural injection and image(s) printed for medical record Attempts: 1 Following insertion, dressing applied, line sutured and Biopatch. Post procedure assessment: blood return through all ports  Patient tolerated the procedure well with no immediate complications.

## 2021-05-10 NOTE — Discharge Instructions (Addendum)
DIET- only liquids until you follow up with Dr. Kipp Brood.  If you notice liquids you are drinking coming out in your drain, please contact our office immediately at [phone] Activity- up as tolerated, avoid strenuous activity for several weeks No driving while taking narcotic pain medication You may shower wash incision with soap and water daily, no tub bathing/swimming until incisions are completely healed If you develop fever of 100.5 or higher please contact our office Do not take any pills or capsules until cleared by Dr. Kipp Brood.  Your Lexapro and Valtrex may be crushed and mixed with a tablespoon of liquid and taken as a slurry.  This should be followed by 6-8 ounces of liquid to assure the medication has cleared your esophagus.

## 2021-05-10 NOTE — Op Note (Signed)
[address]       [place] [address]             [phone]        05/10/2021  Patient:  [name] Pre-Op Dx: Lower esophageal cystic mass   Hiatal hernia   Dysphagia   Retroesophageal right subclavian artery Post-op Dx: Same Procedure: - Esophagoscopy -Robotic assisted right thoracoscopy -Resection of esophageal cystic mass -Intercostal nerve block - Robotic assisted laparoscopy - Paraesophageal hernia repair  -Dor fundoplication   Surgeon and Role:      * Jedediah Noda, Lucile Crater, MD - Primary    *E. Barrett, PA-C- assisting  Anesthesia  general EBL: 100 ml Blood Administration: None Specimen: Esophageal cystic mass   Counts: correct   Indications: 45 year old female who presents for evaluation of esophageal mass.  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  Her main symptoms are reflux and some dysphagia.  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  I also explained that the aberrant right subclavian artery could also be playing a role with her dysphagia.  Her biggest concern however is the reflux.  We discussed options which involves a right robotic assisted thoracoscopy with resection of this esophageal mass as well as a robotic assisted laparoscopy and hiatal hernia repair with fundoplication.   Findings: This cystic mass was deeply invested into the muscular layers of the esophagus.  We were able to dissect this off of the esophagus very carefully.  It was right next to the mucosal layer of the esophagus.  We performed extensive air leak test and there was no bubbles.  Also on endoscopy there did not appear to be any injury or  bruising to the mucosal surface.  The hiatal hernia requiring 3 stitches to repair it.  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  The patient also had a replaced left hepatic artery.  We mobilized this to took care not to injure it or divided.  Operative Technique: After the risks, benefits and alternatives were thoroughly discussed, the patient was brought to the operative theatre.  Anesthesia was induced, and the esophagoscope was passed through the oropharynx down to the stomach.  The patient was then placed in a left lateral decubitus position.  The patient was then prepped and draped in normal sterile fashion and appropriate surgical pause was performed.  We began with for incisions for trocar placement to triangulate the lower esophagus.  The mass was easily visible.  Once the trochars were placed the robot was then docked.  We meticulously dissected the esophageal mass off of the esophagus.  It was deeply invested into the muscular layers.  We were able to dissected freely mucosal surface was evident.  We performed an air leak test using the esophagoscope and there is no evidence of bubbles.  There is also no evidence of any mucosal bruising or injury endoscopically.  The endoscope was then left in place.  An intercostal nerve block was then performed.  Her drains were placed after the robot was undocked.  The trochars were removed and the sites were closed with absorbable suture.  We then placed the  patient in the supine position. The scope was pulled back then parked at 25 cm from the incisors.  The patient was then prepped and draped in normal sterile fashion.  Another appropriate surgical pause was performed, and pre-operative antibiotics were dosed accordingly.  We began with a 1 cm incision 15 cm caudad from the xiphoid and slightly lateral to the umbilicus.  Using an Optiview we entered the peritoneal space.  The abdomen was then insufflated with CO2.   3 other robotic ports were placed to triangulate the hiatus.  Another 12 mm port was placed in place at the level of the umbilicus laterally for an assistant port and another 5 mm trocar was placed in the right lower quadrant for liver retractor.  The patient was then placed in steep reverse Trendelenburg and the liver was elevated to expose the esophageal hiatus.  And then the robot was docked.  We began by dividing the gastrohepatic ligament to expose the right diaphragmatic crus.  There was a repeat placed left hepatic artery that was spared.  We then dissected the hernia sac in a clockwise fashion to mobilize there the stomach and esophagus.  We then divided the short gastrics and moved towards the right crus and completed our dissection along the esophageal hiatus.  A Penrose drain was then used to encircle the the esophagus and we continued our dissection up into the mediastinum.  Once we had achieved 3 to 4 cm of intra-abdominal esophagus we then proceeded to reapproximate the crura with 0 Ethibond sutures in an interrupted fashion.  3 stitches were used to reapproximate the crura.  The gastroscope was passed down through the lower esophageal sphincter into the stomach and would act as our bougie during this repair.  Next the stomach was passed anterior to the esophagus and Dor fundoplication was performed.  An air leak test was performed using the gastroscope.  No leak was evident.  The liver retractor was removed and all ports were removed under direct visualization.  The skin and soft tissue were closed with absorbable suture    The patient tolerated the procedure without any immediate complications, and was transferred to the PACU in stable condition.  Brunetta Newingham Bary Leriche

## 2021-05-10 NOTE — Interval H&P Note (Signed)
History and Physical Interval Note:  05/10/2021 7:34 AM  Kathleen Bird  has presented today for surgery, with the diagnosis of Hiatal Hernia, Esophageal Mass.  The various methods of treatment have been discussed with the patient and family. After consideration of risks, benefits and other options for treatment, the patient has consented to  Procedure(s): RIGHT XI ROBOTIC ASSISTED THORASCOPY RESECTION OF ESOPHAGEAL MASS (Right) XI ROBOTIC ASSISTED LAPAROSCOPY HIATAL HERNIA REPAIR AND FUNDOPLICATION (N/A) as a surgical intervention.  The patient's history has been reviewed, patient examined, no change in status, stable for surgery.  I have reviewed the patient's chart and labs.  Questions were answered to the patient's satisfaction.     Jeneal Vogl Bary Leriche

## 2021-05-10 NOTE — Addendum Note (Signed)
Addendum  created 05/10/21 1637 by Merlinda Frederick, MD   Order list changed, Order sets accessed, Pharmacy for encounter modified

## 2021-05-10 NOTE — Hospital Course (Addendum)
History of Present Illness:  Kathleen Bird 45 y.o. female presents for surgical evaluation of esophageal mass.  This was found incidentally on cross-sectional imaging when she presented to the emergency department with a kidney stone.  She has a long history of esophageal reflux, and is undergone multiple dilations in the past.  She continues to use Prilosec with good relief of her symptoms.  Of late she has had some dysphagia which is of concern to her.  She denies any weight loss.  She was offered surgical resection of esophageal mass and repair of her hernia.  The risks and benefits of the procedure were explained to the patient and she was agreeable to proceed.  Hospital Course:  Kathleen Bird presented to [facility] on 05/10/2021.  She was taken to the operating room and underwent Robotic Assisted Right Video Assisted Thoracoscopy with resection of esophageal mass.  She also underwent Robotic Assisted Laparoscopic Hiatal Hernia Repair with Fundoplication.  She tolerated the procedure without difficulty, was extubated and taken to the PACU in stable condition.  The patient remained NPO overnight.  She underwent an Esophagram to assess for esophageal leak.  This showed Delayed passage through the gastroesophageal junction, likely related to postoperative edema associated with the fundoplication. No evidence of esophageal leak. She was still having trouble swallowing and had issue with food getting stuck while trying to swallow. We scaled her back to a liquid only diet.

## 2021-05-10 NOTE — Transfer of Care (Signed)
Immediate Anesthesia Transfer of Care Note  Patient: Kathleen Bird  Procedure(s) Performed: RIGHT XI ROBOTIC ASSISTED THORASCOPY RESECTION OF ESOPHAGEAL MASS (Right: Chest) XI ROBOTIC ASSISTED LAPAROSCOPY HIATAL HERNIA REPAIR AND FUNDOPLICATION (Abdomen) ESOPHAGOGASTRODUODENOSCOPY (EGD) (Chest)  Patient Location: PACU  Anesthesia Type:General  Level of Consciousness: drowsy and patient cooperative  Airway & Oxygen Therapy: Patient Spontanous Breathing and Patient connected to face mask oxygen  Post-op Assessment: Report given to RN and Post -op Vital signs reviewed and stable  Post vital signs: Reviewed and stable  Last Vitals:  Vitals Value Taken Time  BP 105/73 05/10/21 1344  Temp    Pulse 85 05/10/21 1344  Resp 18 05/10/21 1345  SpO2 98 % 05/10/21 1344  Vitals shown include unvalidated device data.  Last Pain:  Vitals:   05/10/21 0603  TempSrc:   PainSc: 0-No pain         Complications: No notable events documented.

## 2021-05-10 NOTE — Anesthesia Postprocedure Evaluation (Signed)
Anesthesia Post Note  Patient: Kathleen Bird  Procedure(s) Performed: RIGHT XI ROBOTIC ASSISTED THORASCOPY RESECTION OF ESOPHAGEAL MASS (Right: Chest) XI ROBOTIC ASSISTED LAPAROSCOPY HIATAL HERNIA REPAIR AND FUNDOPLICATION (Abdomen) ESOPHAGOGASTRODUODENOSCOPY (EGD) (Chest)     Patient location during evaluation: PACU Anesthesia Type: General Level of consciousness: awake and alert Pain management: pain level controlled Vital Signs Assessment: post-procedure vital signs reviewed and stable Respiratory status: spontaneous breathing, nonlabored ventilation, respiratory function stable and patient connected to nasal cannula oxygen Cardiovascular status: blood pressure returned to baseline and stable Postop Assessment: no apparent nausea or vomiting Anesthetic complications: no   No notable events documented.  Last Vitals:  Vitals:   05/10/21 1445 05/10/21 1515  BP: 96/66 99/70  Pulse: 71 72  Resp: 12 14  Temp:    SpO2: 97% 97%    Last Pain:  Vitals:   05/10/21 1445  TempSrc:   PainSc: 0-No pain                 Belenda Cruise P Shay Bartoli

## 2021-05-11 ENCOUNTER — Observation Stay (HOSPITAL_COMMUNITY): Payer: BC Managed Care – PPO

## 2021-05-11 ENCOUNTER — Encounter (HOSPITAL_COMMUNITY): Payer: Self-pay | Admitting: Thoracic Surgery (Cardiothoracic Vascular Surgery)

## 2021-05-11 DIAGNOSIS — M19041 Primary osteoarthritis, right hand: Secondary | ICD-10-CM | POA: Diagnosis present

## 2021-05-11 DIAGNOSIS — Z83511 Family history of glaucoma: Secondary | ICD-10-CM | POA: Diagnosis not present

## 2021-05-11 DIAGNOSIS — M19042 Primary osteoarthritis, left hand: Secondary | ICD-10-CM | POA: Diagnosis present

## 2021-05-11 DIAGNOSIS — Z87442 Personal history of urinary calculi: Secondary | ICD-10-CM | POA: Diagnosis not present

## 2021-05-11 DIAGNOSIS — D130 Benign neoplasm of esophagus: Secondary | ICD-10-CM | POA: Diagnosis present

## 2021-05-11 DIAGNOSIS — F419 Anxiety disorder, unspecified: Secondary | ICD-10-CM | POA: Diagnosis present

## 2021-05-11 DIAGNOSIS — G43909 Migraine, unspecified, not intractable, without status migrainosus: Secondary | ICD-10-CM | POA: Diagnosis present

## 2021-05-11 DIAGNOSIS — R1310 Dysphagia, unspecified: Secondary | ICD-10-CM | POA: Diagnosis present

## 2021-05-11 DIAGNOSIS — K229 Disease of esophagus, unspecified: Secondary | ICD-10-CM | POA: Diagnosis present

## 2021-05-11 DIAGNOSIS — Z9889 Other specified postprocedural states: Secondary | ICD-10-CM

## 2021-05-11 DIAGNOSIS — K219 Gastro-esophageal reflux disease without esophagitis: Secondary | ICD-10-CM | POA: Diagnosis present

## 2021-05-11 DIAGNOSIS — Z20822 Contact with and (suspected) exposure to covid-19: Secondary | ICD-10-CM | POA: Diagnosis present

## 2021-05-11 DIAGNOSIS — K449 Diaphragmatic hernia without obstruction or gangrene: Secondary | ICD-10-CM | POA: Diagnosis present

## 2021-05-11 DIAGNOSIS — Z885 Allergy status to narcotic agent status: Secondary | ICD-10-CM | POA: Diagnosis not present

## 2021-05-11 DIAGNOSIS — Z79899 Other long term (current) drug therapy: Secondary | ICD-10-CM | POA: Diagnosis not present

## 2021-05-11 LAB — CBC
HCT: 36.8 % (ref 36.0–46.0)
Hemoglobin: 11.7 g/dL — ABNORMAL LOW (ref 12.0–15.0)
MCH: 30.2 pg (ref 26.0–34.0)
MCHC: 31.8 g/dL (ref 30.0–36.0)
MCV: 95.1 fL (ref 80.0–100.0)
Platelets: 197 10*3/uL (ref 150–400)
RBC: 3.87 MIL/uL (ref 3.87–5.11)
RDW: 12.5 % (ref 11.5–15.5)
WBC: 6 10*3/uL (ref 4.0–10.5)
nRBC: 0 % (ref 0.0–0.2)

## 2021-05-11 LAB — BASIC METABOLIC PANEL
Anion gap: 5 (ref 5–15)
BUN: 11 mg/dL (ref 6–20)
CO2: 24 mmol/L (ref 22–32)
Calcium: 8.4 mg/dL — ABNORMAL LOW (ref 8.9–10.3)
Chloride: 107 mmol/L (ref 98–111)
Creatinine, Ser: 0.78 mg/dL (ref 0.44–1.00)
GFR, Estimated: >60 mL/min (ref >60)
Glucose, Bld: 93 mg/dL (ref 70–99)
Potassium: 3.9 mmol/L (ref 3.5–5.1)
Sodium: 136 mmol/L (ref 135–145)

## 2021-05-11 LAB — ABO/RH: ABO/RH(D): B POS

## 2021-05-11 LAB — SURGICAL PATHOLOGY

## 2021-05-11 MED ORDER — SUMATRIPTAN 20 MG/ACT NA SOLN
20.0000 mg | NASAL | Status: DC | PRN
  Administered 2021-05-11 – 2021-05-13 (×2): 20 mg via NASAL
  Filled 2021-05-11 (×2): qty 1

## 2021-05-11 MED ORDER — DIATRIZOATE MEGLUMINE & SODIUM 66-10 % PO SOLN
120.0000 mL | Freq: Once | ORAL | Status: AC
  Administered 2021-05-11: 50 mL via ORAL
  Filled 2021-05-11: qty 120

## 2021-05-11 NOTE — Progress Notes (Addendum)
      [address]       [place] [address]             [phone]      1 Day Post-Op Procedure(s) (LRB): RIGHT XI ROBOTIC ASSISTED THORASCOPY RESECTION OF ESOPHAGEAL MASS (Right) XI ROBOTIC ASSISTED LAPAROSCOPY HIATAL HERNIA REPAIR AND FUNDOPLICATION (N/A) ESOPHAGOGASTRODUODENOSCOPY (EGD) (N/A) Subjective: Up in the bedside chair, awake and alert. Has soreness with deep breathing but says pain control is adequate.   Objective: Vital signs in last 24 hours: Temp:  [97.2 F (36.2 C)-98.1 F (36.7 C)] 97.9 F (36.6 C) (08/19 0708) Pulse Rate:  [60-89] 84 (08/19 0708) Cardiac Rhythm: Normal sinus rhythm (08/19 0704) Resp:  [12-20] 13 (08/19 0708) BP: (89-110)/(60-80) 108/68 (08/19 0708) SpO2:  [94 %-99 %] 96 % (08/19 0708)     Intake/Output from previous day: 08/18 0701 - 08/19 0700 In: 5029.3 [P.O.:120; I.V.:4359.3; IV Piggyback:550] Out: 2685 [Urine:2450; Chest Tube:235] Intake/Output this shift: No intake/output data recorded.  General appearance: alert, cooperative, and no distress Neurologic: intact Heart: regular rate and rhythm Lungs: clear to auscultation bilaterally Abdomen: soft and non-tender Wound: port incisions dry.   Lab Results: Recent Labs    05/08/21 1200 05/11/21 0527  WBC 4.7 6.0  HGB 13.5 11.7*  HCT 42.0 36.8  PLT 215 197   BMET:  Recent Labs    05/08/21 1200 05/11/21 0527  NA 138 136  K 3.8 3.9  CL 105 107  CO2 24 24  GLUCOSE 84 93  BUN 15 11  CREATININE 0.81 0.78  CALCIUM 9.1 8.4*    PT/INR:  Recent Labs    05/08/21 1200  LABPROT 12.8  INR 1.0   ABG    Component Value Date/Time   PHART 7.346 (L) 05/10/2021 0730   HCO3 21.5 05/10/2021 0730   ACIDBASEDEF 3.3 (H) 05/10/2021 0730   O2SAT 97.4 05/10/2021 0730   CBG (last 3)  No results for input(s): GLUCAP in the last 72 hours.  Assessment/Plan: S/P Procedure(s) (LRB): RIGHT XI ROBOTIC ASSISTED THORASCOPY RESECTION OF ESOPHAGEAL MASS (Right) XI  ROBOTIC ASSISTED LAPAROSCOPY HIATAL HERNIA REPAIR AND FUNDOPLICATION (N/A) ESOPHAGOGASTRODUODENOSCOPY (EGD) (N/A)  -POD-1   robotic-assisted resection of esophageal mass and repair of para-esophageal hernia.  Pain controlled. Plan for Gastrograffin swallow this morning and start po liquids if appropriate. Continue IIVF for now.   Moderate serosanguinous drainage from CT and JP drains-- will leave in place today. Mobilize.  -DVT PPX- on daily [place] enoxaparin, ambulate    LOS: 1 day    Antony Odea, PA-C [phone] 05/11/2021  Agree with above. Will remove chest tube. Swallow study negative.  Started on dysphagia diet. Dispo planning.  Aaniyah Strohm Bary Leriche

## 2021-05-11 NOTE — Discharge Summary (Signed)
Physician Discharge Summary  Patient ID: MEEKAH FURLOUGH MRN: [id number] DOB/AGE: 1976/02/24 45 y.o.  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 female presents for surgical evaluation of esophageal mass.  This was found incidentally on cross-sectional imaging when she presented to the emergency department with a kidney stone.  She has a long history of esophageal reflux, and is undergone multiple dilations in the past.  She continues to use Prilosec with good relief of her symptoms.  Of late she has had some dysphagia which is of concern to her.  She denies any weight loss.  She was offered surgical resection of esophageal mass and repair of her hernia.  The risks and benefits of the procedure were explained to the patient and she was agreeable to proceed.  Hospital Course:  Kathleen Bird presented to [facility] on 05/10/2021.  She was taken to the operating room and underwent Robotic Assisted Right Video Assisted Thoracoscopy with resection of esophageal mass.  She also underwent Robotic Assisted Laparoscopic Hiatal Hernia Repair with Fundoplication.  She tolerated the procedure without difficulty, was extubated and taken to the PACU in stable condition.  The patient remained NPO overnight.  She underwent an Esophagram to assess for esophageal leak.  This showed no  evidence of esophageal mocosal injury or leak. There was also "Delayed passage through the gastroesophageal junction, likely related to postoperative edema associated with the fundoplication." She was started on a dysphagia I diet on post-op day 1. The chest tube was removed and the JP drain was left in place. She was having some chest pain with soft foods and the sensation that the foods were getting stuck. We switched her back to a liquid diet on POD 3. On POD 4 she was tolerating liquids and the JP drainage remained low. We removed the JP drain and she was cleared for discharge. She will follow-up on Friday to review her pathology with Dr. Kipp Brood and possibly advance her diet.   Consults: None  Significant Diagnostic Studies:    CLINICAL DATA:  Postop resection of paraesophageal mass, hiatal hernia repair and fundoplication yesterday.   EXAM: ESOPHOGRAM/BARIUM SWALLOW   TECHNIQUE: Single contrast examination was performed using water-soluble contrast (Gastrografin) followed by thin barium.   FLUOROSCOPY TIME:  Fluoroscopy Time: 1 minutes of low-dose pulsed fluoroscopy   Radiation Exposure Index (if provided by the fluoroscopic device): 9.9 mGy   Number of Acquired Spot Images: 1   COMPARISON:  Chest CT 04/10/2021. Chest radiographs 05/11/2021 and 05/10/2021.   FINDINGS: Study was initiated with the patient in the supine semi erect view. Right chest tube and central line are in place. There are multiple calcified mediastinal and hilar lymph nodes.   Patient swallowed the Gastrografin without difficulty. There was no evidence of esophageal leak or mucosal irregularity. The gastroesophageal junction is narrowed status post complication, although contrast passed into the gastric fundus. Further esophageal assessment was performed by placing  the patient in the more erect, left and partial right lateral decubitus positions.   Subsequently, small amount of thin barium was  administered. This demonstrates no leak or mucosal abnormality.   IMPRESSION: 1. No evidence of esophageal leak or mucosal injury status post paraesophageal mass resection and fundoplication. 2. Delayed passage through the gastroesophageal junction, likely related to postoperative edema associated with the fundoplication.     Electronically Signed   By: Richardean Sale M.D.   On: 05/11/2021 08:44   Treatments:  05/10/2021   Patient:  Kathleen Bird Pre-Op Dx: Lower esophageal cystic mass                         Hiatal hernia                         Dysphagia                         Retroesophageal right subclavian artery Post-op Dx: Same Procedure: - Esophagoscopy -Robotic assisted right thoracoscopy -Resection of esophageal cystic mass -Intercostal nerve block - Robotic assisted laparoscopy - Paraesophageal hernia repair  -Dor fundoplication     Surgeon and Role:      * Lightfoot, Lucile Crater, MD - Primary    *E. Barrett, PA-C- assisting   Anesthesia  general EBL: 100 ml Blood Administration: None Specimen: Esophageal cystic mass     Counts: correct     Indications: 45 year old female who presents for evaluation of esophageal mass.  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  Her main symptoms are reflux and some dysphagia.  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  I also explained that the aberrant right subclavian artery could also be playing a role with her dysphagia.  Her biggest concern however is the reflux.  We discussed options which involves a right robotic assisted thoracoscopy with resection of this esophageal mass as well as a robotic assisted  laparoscopy and hiatal hernia repair with fundoplication.    Discharge Exam: Blood pressure 114/85, pulse 80, temperature 98.7 F (37.1 C), temperature source Oral, resp. rate 18, height '5\' 3"'$  (1.6 m), weight 72.1 kg, last menstrual period 04/23/2021, SpO2 95 %.  General appearance: alert, cooperative, and no distress Heart: regular rate and rhythm, S1, S2 normal, no murmur, click, rub or gallop Lungs: clear to auscultation bilaterally Abdomen: soft, non-tender; bowel sounds normal; no masses,  no organomegaly Extremities: extremities normal, atraumatic, no cyanosis or edema Wound: clean and dry  Disposition: Discharge disposition: 01-Home or Self Care      Allergies as of 05/14/2021       Reactions   Dilaudid [hydromorphone Hcl] Nausea And Vomiting        Medication List     STOP taking these medications    multivitamin tablet   omeprazole 40 MG capsule Commonly known as: PRILOSEC Replaced by: omeprazole 2 mg/mL Susp oral suspension   SUMAtriptan 100 MG tablet Commonly known as: IMITREX       TAKE these medications    escitalopram 20 MG tablet Commonly known as: LEXAPRO Take 20 mg by mouth daily.  Galcanezumab-gnlm 120 MG/ML Soaj Inject 120 mg/mL into the skin every 30 (thirty) days.   omeprazole 2 mg/mL Susp oral suspension Commonly known as: FIRST-Omeprazole Take 10 mLs (20 mg total) by mouth 2 (two) times daily before a meal. Replaces: omeprazole 40 MG capsule   ondansetron 4 MG disintegrating tablet Commonly known as: Zofran ODT Take 1 tablet (4 mg total) by mouth every 8 (eight) hours as needed for nausea or vomiting.   oxyCODONE 20 MG/ML concentrated solution Commonly known as: ROXICODONE INTENSOL Take 0.3 mLs (6 mg total) by mouth every 6 (six) hours as needed for up to 7 days for severe pain.   SUMAtriptan 20 MG/ACT nasal spray Commonly known as: Imitrex Place 1 spray (20 mg total) into the nose every 2 (two) hours as needed for migraine  or headache. May repeat in 2 hours if headache persists or recurs.   valACYclovir 500 MG tablet Commonly known as: VALTREX Take 500 mg by mouth daily as needed (fever blisters).        Follow-up Information     [name], MD Follow up on 05/18/2021.   Specialty: Cardiothoracic Surgery Why: Appointment is at 10:20 Contact information: [address] [place] [address] [phone]         Harlan Stains, MD. Call today.   Specialty: Family Medicine Contact information: [place] [address] [phone]                 Signed: Elgie Collard, PA-C  05/14/2021, 8:35 AM

## 2021-05-12 MED ORDER — ALUM & MAG HYDROXIDE-SIMETH 200-200-20 MG/5ML PO SUSP
30.0000 mL | Freq: Four times a day (QID) | ORAL | Status: DC | PRN
  Administered 2021-05-12: 30 mL via ORAL
  Filled 2021-05-12: qty 30

## 2021-05-12 NOTE — Progress Notes (Signed)
2 Days Post-Op Procedure(s) (LRB): RIGHT XI ROBOTIC ASSISTED THORASCOPY RESECTION OF ESOPHAGEAL MASS (Right) XI ROBOTIC ASSISTED LAPAROSCOPY HIATAL HERNIA REPAIR AND FUNDOPLICATION (N/A) ESOPHAGOGASTRODUODENOSCOPY (EGD) (N/A) Subjective: C/o back and right shoulder pain  Objective: Vital signs in last 24 hours: Temp:  [97.7 F (36.5 C)-98.2 F (36.8 C)] 98.1 F (36.7 C) (08/20 0718) Pulse Rate:  [70-80] 70 (08/20 0718) Cardiac Rhythm: Normal sinus rhythm (08/20 0715) Resp:  [16-22] 22 (08/20 0718) BP: (105-116)/(69-82) 115/82 (08/20 0718) SpO2:  [91 %-95 %] 94 % (08/20 0718)  Hemodynamic parameters for last 24 hours:    Intake/Output from previous day: 08/19 0701 - 08/20 0700 In: 50 [P.O.:50] Out: 101 [Urine:1; Drains:40; Chest Tube:60] Intake/Output this shift: No intake/output data recorded.  General appearance: alert, cooperative, and no distress Neurologic: intact Heart: regular rate and rhythm Lungs: diminished breath sounds bibasilar Abdomen: normal findings: soft, non-tender Wound: erythema at abdominal incisions  Lab Results: Recent Labs    05/11/21 0527  WBC 6.0  HGB 11.7*  HCT 36.8  PLT 197   BMET:  Recent Labs    05/11/21 0527  NA 136  K 3.9  CL 107  CO2 24  GLUCOSE 93  BUN 11  CREATININE 0.78  CALCIUM 8.4*    PT/INR: No results for input(s): LABPROT, INR in the last 72 hours. ABG    Component Value Date/Time   PHART 7.346 (L) 05/10/2021 0730   HCO3 21.5 05/10/2021 0730   ACIDBASEDEF 3.3 (H) 05/10/2021 0730   O2SAT 97.4 05/10/2021 0730   CBG (last 3)  No results for input(s): GLUCAP in the last 72 hours.  Assessment/Plan: S/P Procedure(s) (LRB): RIGHT XI ROBOTIC ASSISTED THORASCOPY RESECTION OF ESOPHAGEAL MASS (Right) XI ROBOTIC ASSISTED LAPAROSCOPY HIATAL HERNIA REPAIR AND FUNDOPLICATION (N/A) ESOPHAGOGASTRODUODENOSCOPY (EGD) (N/A) POD # 2 Overall looks good Minimal output from [place] drain, serosanguinous C/o food sticking when  she swallows. GG swallow showed delayed passage through GE junction. Advised to eat slowly and drink liquids Continue ambulation   LOS: 2 days    Melrose Nakayama 05/12/2021

## 2021-05-12 NOTE — Plan of Care (Signed)

## 2021-05-13 MED ORDER — OXYCODONE HCL 20 MG/ML PO CONC
5.0000 mg | Freq: Four times a day (QID) | ORAL | Status: DC | PRN
  Administered 2021-05-13 – 2021-05-14 (×2): 5 mg via ORAL
  Filled 2021-05-13 (×2): qty 1

## 2021-05-13 NOTE — Progress Notes (Addendum)
      [address]       [place] [address]             [phone]      3 Days Post-Op Procedure(s) (LRB): RIGHT XI ROBOTIC ASSISTED THORASCOPY RESECTION OF ESOPHAGEAL MASS (Right) XI ROBOTIC ASSISTED LAPAROSCOPY HIATAL HERNIA REPAIR AND FUNDOPLICATION (N/A) ESOPHAGOGASTRODUODENOSCOPY (EGD) (N/A) Subjective: She is having discomfort in her chest when she eats like things are getting stuck. She also has experienced a lot of belching. Only able to take a few small bites at a time.   Objective: Vital signs in last 24 hours: Temp:  [98 F (36.7 C)-98.1 F (36.7 C)] 98 F (36.7 C) (08/21 0718) Pulse Rate:  [71-95] 78 (08/21 0718) Cardiac Rhythm: Normal sinus rhythm (08/20 1900) Resp:  [15-20] 18 (08/21 0718) BP: (107-120)/(73-85) 120/82 (08/21 0718) SpO2:  [94 %-98 %] 98 % (08/21 0718)     Intake/Output from previous day: 08/20 0701 - 08/21 0700 In: 450 [P.O.:450] Out: 840 [Urine:750; Drains:90] Intake/Output this shift: Total I/O In: 240 [P.O.:240] Out: 200 [Urine:200]  General appearance: alert, cooperative, and no distress Heart: regular rate and rhythm, S1, S2 normal, no murmur, click, rub or gallop Lungs: clear to auscultation bilaterally Abdomen: soft, non-tender; bowel sounds normal; no masses,  no organomegaly Extremities: extremities normal, atraumatic, no cyanosis or edema Wound: clean and dry  Lab Results: Recent Labs    05/11/21 0527  WBC 6.0  HGB 11.7*  HCT 36.8  PLT 197   BMET:  Recent Labs    05/11/21 0527  NA 136  K 3.9  CL 107  CO2 24  GLUCOSE 93  BUN 11  CREATININE 0.78  CALCIUM 8.4*    PT/INR: No results for input(s): LABPROT, INR in the last 72 hours. ABG    Component Value Date/Time   PHART 7.346 (L) 05/10/2021 0730   HCO3 21.5 05/10/2021 0730   ACIDBASEDEF 3.3 (H) 05/10/2021 0730   O2SAT 97.4 05/10/2021 0730   CBG (last 3)  No results for input(s): GLUCAP in the last 72 hours.  Assessment/Plan: S/P  Procedure(s) (LRB): RIGHT XI ROBOTIC ASSISTED THORASCOPY RESECTION OF ESOPHAGEAL MASS (Right) XI ROBOTIC ASSISTED LAPAROSCOPY HIATAL HERNIA REPAIR AND FUNDOPLICATION (N/A) ESOPHAGOGASTRODUODENOSCOPY (EGD) (N/A)   CV-NR in the 80s, BP well controlled Pulm-tolerating room air. Minimal chest tube output. Blood tinged drainage.  GI-swallow showed delayed passage through the GE junction. Still having difficultly swallowing even liquids. Lot of belching and chest discomfort.   Plan: Discussed taking very small bites at a time and eating slowly. May need to repeat swallow study if symptoms do not improve.    LOS: 3 days    Elgie Collard 05/13/2021 Significant dysphagia with even small amounts of food. Will change to full liquid diet, hopefully some of this is due to edema and will improve in short term  Remo Lipps C. Roxan Hockey, MD [facility] [phone]

## 2021-05-14 MED ORDER — OMEPRAZOLE 2 MG/ML ORAL SUSPENSION: 20.0000 mg | Freq: Two times a day (BID) | ORAL | 1 refills | Status: DC

## 2021-05-14 MED ORDER — ONDANSETRON 4 MG PO TBDP
4.0000 mg | ORAL_TABLET | Freq: Three times a day (TID) | ORAL | 2 refills | Status: DC | PRN
Start: 2021-05-14 — End: 2022-02-11

## 2021-05-14 MED ORDER — OXYCODONE HCL 20 MG/ML PO CONC: 6.0000 mg | Freq: Four times a day (QID) | ORAL | 0 refills | Status: AC | PRN

## 2021-05-14 MED ORDER — SUMATRIPTAN 20 MG/ACT NA SOLN: 20.0000 mg | NASAL | 1 refills | Status: DC | PRN

## 2021-05-14 NOTE — Plan of Care (Signed)

## 2021-05-14 NOTE — Progress Notes (Signed)
Discharged home accompanied by sister. Belongings taken home.

## 2021-05-14 NOTE — Progress Notes (Signed)
      [address]       [place] [address]             [phone]      4 Days Post-Op Procedure(s) (LRB): RIGHT XI ROBOTIC ASSISTED THORASCOPY RESECTION OF ESOPHAGEAL MASS (Right) XI ROBOTIC ASSISTED LAPAROSCOPY HIATAL HERNIA REPAIR AND FUNDOPLICATION (N/A) ESOPHAGOGASTRODUODENOSCOPY (EGD) (N/A) Subjective: Able to tolerate liquids without issue. She is still having issues with soft foods and feeling like they are getting stuck.  Objective: Vital signs in last 24 hours: Temp:  [97.9 F (36.6 C)-98.7 F (37.1 C)] 98 F (36.7 C) (08/21 2355) Pulse Rate:  [74-79] 79 (08/21 2355) Cardiac Rhythm: Normal sinus rhythm (08/21 2017) Resp:  [16-17] 16 (08/21 2355) BP: (112-114)/(78-84) 112/78 (08/21 2355) SpO2:  [92 %-95 %] 95 % (08/21 2355)      Intake/Output from previous day: 08/21 0701 - 08/22 0700 In: 1258.8 [P.O.:720; I.V.:538.8] Out: 635 [Urine:500; Drains:135] Intake/Output this shift: No intake/output data recorded.  General appearance: alert, cooperative, and no distress Heart: regular rate and rhythm, S1, S2 normal, no murmur, click, rub or gallop Lungs: clear to auscultation bilaterally Abdomen: soft, non-tender; bowel sounds normal; no masses,  no organomegaly Extremities: extremities normal, atraumatic, no cyanosis or edema Wound: clean and dry  Lab Results: No results for input(s): WBC, HGB, HCT, PLT in the last 72 hours. BMET: No results for input(s): NA, K, CL, CO2, GLUCOSE, BUN, CREATININE, CALCIUM in the last 72 hours.  PT/INR: No results for input(s): LABPROT, INR in the last 72 hours. ABG    Component Value Date/Time   PHART 7.346 (L) 05/10/2021 0730   HCO3 21.5 05/10/2021 0730   ACIDBASEDEF 3.3 (H) 05/10/2021 0730   O2SAT 97.4 05/10/2021 0730   CBG (last 3)  No results for input(s): GLUCAP in the last 72 hours.  Assessment/Plan: S/P Procedure(s) (LRB): RIGHT XI ROBOTIC ASSISTED THORASCOPY RESECTION OF ESOPHAGEAL MASS  (Right) XI ROBOTIC ASSISTED LAPAROSCOPY HIATAL HERNIA REPAIR AND FUNDOPLICATION (N/A) ESOPHAGOGASTRODUODENOSCOPY (EGD) (N/A)  CV-NR in the 80s, BP well controlled Pulm-tolerating room air. Minimal chest tube output. Blood tinged drainage.  GI-swallow showed delayed passage through the GE junction. Tolerating liquids without issue. She did have some pain in her chest with soft foods and they felt like they were getting stuck.    Plan: Remove the JP drain today. Continue liquids for now. She can go home later today. She will follow-up on Friday.    LOS: 4 days    Elgie Collard 05/14/2021

## 2021-05-18 ENCOUNTER — Encounter: Payer: Self-pay | Admitting: Thoracic Surgery (Cardiothoracic Vascular Surgery)

## 2021-05-18 ENCOUNTER — Other Ambulatory Visit: Payer: Self-pay

## 2021-05-18 ENCOUNTER — Ambulatory Visit (INDEPENDENT_AMBULATORY_CARE_PROVIDER_SITE_OTHER): Payer: Self-pay | Admitting: Thoracic Surgery (Cardiothoracic Vascular Surgery)

## 2021-05-18 VITALS — BP 109/76 | HR 97 | Resp 20 | Ht 63.0 in | Wt 154.0 lb

## 2021-05-18 DIAGNOSIS — K449 Diaphragmatic hernia without obstruction or gangrene: Secondary | ICD-10-CM

## 2021-05-18 DIAGNOSIS — Z9889 Other specified postprocedural states: Secondary | ICD-10-CM

## 2021-05-18 DIAGNOSIS — K2289 Other specified disease of esophagus: Secondary | ICD-10-CM

## 2021-05-18 DIAGNOSIS — D378 Neoplasm of uncertain behavior of other specified digestive organs: Secondary | ICD-10-CM

## 2021-05-18 DIAGNOSIS — Z8719 Personal history of other diseases of the digestive system: Secondary | ICD-10-CM

## 2021-05-18 DIAGNOSIS — N289 Disorder of kidney and ureter, unspecified: Secondary | ICD-10-CM

## 2021-05-18 DIAGNOSIS — D398 Neoplasm of uncertain behavior of other specified female genital organs: Secondary | ICD-10-CM

## 2021-05-18 NOTE — Progress Notes (Signed)
      [address]       ,[place] [address]             [phone]        Kathleen Bird [place] Medical Record [id number] Date of Birth: 1975-11-14  Referring: Berniece Salines, DO Primary Care: Harlan Stains, MD Primary Cardiologist:None  Reason for visit:   follow-up  History of Present Illness:     Kathleen Bird presents today for 1 week follow-up.  She continues to have some dysphagia to have more solid food.  Liquids are able to pass without any difficulty.  She states that most of her dysphagia feels more cervical.  She denies any pain with any of her incisions.  Physical Exam: BP 109/76   Pulse 97   Resp 20   Ht '5\' 3"'$  (1.6 m)   Wt 154 lb (69.9 kg)   LMP 04/23/2021   SpO2 97% Comment: RA  BMI 27.28 kg/m   Alert NAD Incision clean, stitch removed.   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    Assessment / Plan:   45 year old female status post right VATS resection of duplication cyst, and robotic hiatal hernia repair with Dor fundoplication.  She continues to have some dysphagia.  This is not surprising given the amount of dissection that was required for removal of the duplication cyst.  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  I will see her back in 1 week as a virtual visit, if her swallowing has not much improved I will send her back to Dr. Silverio Decamp for further evaluation.   [name] 05/18/2021 11:18 AM

## 2021-05-24 ENCOUNTER — Other Ambulatory Visit: Payer: Self-pay

## 2021-05-24 ENCOUNTER — Ambulatory Visit (INDEPENDENT_AMBULATORY_CARE_PROVIDER_SITE_OTHER): Payer: Self-pay | Admitting: *Deleted

## 2021-05-24 DIAGNOSIS — Z4801 Encounter for change or removal of surgical wound dressing: Secondary | ICD-10-CM

## 2021-05-24 DIAGNOSIS — Z4802 Encounter for removal of sutures: Secondary | ICD-10-CM

## 2021-05-24 NOTE — Progress Notes (Signed)
Patient contacted the office stating she had two sutures remaining s/p repair of paraesophageal hernia 8/18 by Dr. Kipp Brood. Upon inspection of surgical sites, two of the suture knots have begun to make their way out. The tail ends of each suture cut shorter for patient comfort. Advised patient the knots will fall off with time. Advised patient to call if any issues begin to occur. Patient verbalized understanding.

## 2021-05-25 ENCOUNTER — Telehealth (INDEPENDENT_AMBULATORY_CARE_PROVIDER_SITE_OTHER): Payer: Self-pay | Admitting: Thoracic Surgery (Cardiothoracic Vascular Surgery)

## 2021-05-25 DIAGNOSIS — Z9889 Other specified postprocedural states: Secondary | ICD-10-CM

## 2021-05-25 NOTE — Progress Notes (Signed)
     [address]       [place] [address]             [phone]       Patient: Home Provider: Office Consent for Telemedicine visit obtained.  Today's visit was completed via a real-time telehealth (see specific modality noted below). The patient/authorized person provided oral consent at the time of the visit to engage in a telemedicine encounter with the present provider at [facility]. The patient/authorized person was informed of the potential benefits, limitations, and risks of telemedicine. The patient/authorized person expressed understanding that the laws that protect confidentiality also apply to telemedicine. The patient/authorized person acknowledged understanding that telemedicine does not provide emergency services and that he or she would need to call 911 or proceed to the nearest hospital for help if such a need arose.   Total time spent in the clinical discussion 10 minutes.  Telehealth Modality: Phone visit (audio only)  I had a telephone visit with Kathleen Bird.  She states that she continues to have some cervical dysphagia.  It is slightly improved, but she is not able to tolerate much solid food.  Plan to order another esophagram.  On her postoperative images she did have some delayed emptying into the stomach.  I want to make sure that this is improved.  Other thing to consider would be an EGD and possible dilation.  We will see her back in 1 week  [place]

## 2021-05-29 ENCOUNTER — Other Ambulatory Visit: Payer: Self-pay | Admitting: Thoracic Surgery (Cardiothoracic Vascular Surgery)

## 2021-05-29 DIAGNOSIS — Z9889 Other specified postprocedural states: Secondary | ICD-10-CM

## 2021-06-01 ENCOUNTER — Ambulatory Visit
Admission: RE | Admit: 2021-06-01 | Discharge: 2021-06-01 | Disposition: A | Discharge status: Home / Self Care | Payer: BC Managed Care – PPO | Source: Ambulatory Visit | Attending: Thoracic Surgery (Cardiothoracic Vascular Surgery) | Admitting: Thoracic Surgery (Cardiothoracic Vascular Surgery)

## 2021-06-01 ENCOUNTER — Other Ambulatory Visit: Payer: Self-pay | Admitting: Thoracic Surgery (Cardiothoracic Vascular Surgery)

## 2021-06-01 DIAGNOSIS — Z8719 Personal history of other diseases of the digestive system: Secondary | ICD-10-CM

## 2021-06-08 ENCOUNTER — Other Ambulatory Visit: Payer: Self-pay

## 2021-06-08 ENCOUNTER — Other Ambulatory Visit: Payer: Self-pay | Admitting: *Deleted

## 2021-06-08 ENCOUNTER — Ambulatory Visit (INDEPENDENT_AMBULATORY_CARE_PROVIDER_SITE_OTHER): Payer: Self-pay | Admitting: Thoracic Surgery (Cardiothoracic Vascular Surgery)

## 2021-06-08 VITALS — BP 121/82 | HR 90 | Resp 20 | Ht 63.0 in | Wt 151.0 lb

## 2021-06-08 DIAGNOSIS — Z8719 Personal history of other diseases of the digestive system: Secondary | ICD-10-CM

## 2021-06-08 DIAGNOSIS — R1310 Dysphagia, unspecified: Secondary | ICD-10-CM

## 2021-06-08 DIAGNOSIS — Z9889 Other specified postprocedural states: Secondary | ICD-10-CM

## 2021-06-08 NOTE — Progress Notes (Signed)
     [address]       [name] [address]             [phone]       Kathleen Bird presents in follow-up.  She continues to have some dysphagia.  The swallow study that was performed does show passage of liquids however the pill was stuck at the GE junction.  There was some distention at the lower esophageal junction.  Her weight has been stable, and she is mostly maintaining herself on liquids.  My concern is that the myotomy that was required for resection of the duplication cyst has weakened the muscle superior to the fundoplication.  We discussed the risks and benefits of repeat robotic assisted laparoscopy with revision of her fundoplication to address this.  She will also undergo an upper endoscopy at the same time.  She is tentatively scheduled for the 22th of this month.

## 2021-06-08 NOTE — H&P (View-Only) (Signed)
     [address]       [place] [address]             [phone]       Kathleen Bird presents in follow-up.  She continues to have some dysphagia.  The swallow study that was performed does show passage of liquids however the pill was stuck at the GE junction.  There was some distention at the lower esophageal junction.  Her weight has been stable, and she is mostly maintaining herself on liquids.  My concern is that the myotomy that was required for resection of the duplication cyst has weakened the muscle superior to the fundoplication.  We discussed the risks and benefits of repeat robotic assisted laparoscopy with revision of her fundoplication to address this.  She will also undergo an upper endoscopy at the same time.  She is tentatively scheduled for the 22th of this month.

## 2021-06-11 NOTE — Progress Notes (Signed)
Surgical Instructions    Your procedure is scheduled on June 14, 2021.  Report to [facility] Main Entrance "A" at 09:50 A.M., then check in with the Admitting office.  Call this number if you have problems the morning of surgery:  [phone]   If you have any questions prior to your surgery date call [phone]: Open Monday-Friday 8am-4pm    Remember:  Do not eat or drink after midnight the night before your surgery    Take these medicines the morning of surgery with A SIP OF WATER: Omeprazole (First-Omeprazole)  Sumatriptan (Imitrex) if needed Valacyclovir (Valtrex) if needed   As of today, STOP taking any Aspirin (unless otherwise instructed by your surgeon) Aleve, Naproxen, Ibuprofen, Motrin, Advil, Goody's, BC's, all herbal medications, fish oil, and all vitamins.          Do not wear jewelry or makeup Do not wear lotions, powders, perfumes, or deodorant. Do not shave 48 hours prior to surgery.   Do not bring valuables to the hospital. DO Not wear nail polish, gel polish, artificial nails, or any other type of covering on natural nails including finger and toenails. If patients have artificial nails, gel coating, etc. that need to be removed by a nail salon please have this removed prior to surgery. Surgery may need to be canceled/delayed if the surgeon/ anesthesia feels like the patient is unable to be adequately monitored.             [facility] is not responsible for any belongings or valuables.  Do NOT Smoke (Tobacco/Vaping)  24 hours prior to your procedure If you use a CPAP at night, you may bring your mask for your overnight stay.   Contacts, glasses, dentures or bridgework may not be worn into surgery, please bring cases for these belongings   For patients admitted to the hospital, discharge time will be determined by your treatment team.   Patients discharged the day of surgery will not be allowed to drive home, and someone needs to stay with them for 24  hours.  NO VISITORS WILL BE ALLOWED IN PRE-OP WHERE PATIENTS GET READY FOR SURGERY.  ONLY 1 SUPPORT PERSON MAY BE PRESENT WHILE YOU ARE IN SURGERY.  IF YOU ARE TO BE ADMITTED, ONCE YOU ARE IN YOUR ROOM YOU WILL BE ALLOWED TWO (2) VISITORS.  Minor children may have two parents present. Special consideration for safety and communication needs will be reviewed on a case by case basis.  Special instructions:    Oral Hygiene is also important to reduce your risk of infection.  Remember - BRUSH YOUR TEETH THE MORNING OF SURGERY WITH YOUR REGULAR TOOTHPASTE   [facility]- Preparing For Surgery  Before surgery, you can play an important role. Because skin is not sterile, your skin needs to be as free of germs as possible. You can reduce the number of germs on your skin by washing with CHG (chlorahexidine gluconate) Soap before surgery.  CHG is an antiseptic cleaner which kills germs and bonds with the skin to continue killing germs even after washing.     Please do not use if you have an allergy to CHG or antibacterial soaps. If your skin becomes reddened/irritated stop using the CHG.  Do not shave (including legs and underarms) for at least 48 hours prior to first CHG shower. It is OK to shave your face.  Please follow these instructions carefully.     Shower the NIGHT BEFORE SURGERY and the MORNING OF SURGERY with  CHG Soap.   If you chose to wash your hair, wash your hair first as usual with your normal shampoo. After you shampoo, rinse your hair and body thoroughly to remove the shampoo.  Then [employer] and genitals (private parts) with your normal soap and rinse thoroughly to remove soap.  After that Use CHG Soap as you would any other liquid soap. You can apply CHG directly to the skin and wash gently with a scrungie or a clean washcloth.   Apply the CHG Soap to your body ONLY FROM THE NECK DOWN.  Do not use on open wounds or open sores. Avoid contact with your eyes, ears, mouth and genitals  (private parts). Wash Face and genitals (private parts)  with your normal soap.   Wash thoroughly, paying special attention to the area where your surgery will be performed.  Thoroughly rinse your body with warm water from the neck down.  DO NOT shower/wash with your normal soap after using and rinsing off the CHG Soap.  Pat yourself dry with a CLEAN TOWEL.  Wear CLEAN PAJAMAS to bed the night before surgery  Place CLEAN SHEETS on your bed the night before your surgery  DO NOT SLEEP WITH PETS.   Day of Surgery:  Take a shower with CHG soap. Wear Clean/Comfortable clothing the morning of surgery Do not apply any deodorants/lotions.   Remember to brush your teeth WITH YOUR REGULAR TOOTHPASTE.   Please read over the following fact sheets that you were given.

## 2021-06-12 ENCOUNTER — Encounter (HOSPITAL_COMMUNITY): Payer: Self-pay

## 2021-06-12 ENCOUNTER — Encounter (HOSPITAL_COMMUNITY)
Admission: RE | Admit: 2021-06-12 | Discharge: 2021-06-12 | Disposition: A | Discharge status: Home / Self Care | Payer: BC Managed Care – PPO | Source: Ambulatory Visit | Attending: Thoracic Surgery (Cardiothoracic Vascular Surgery) | Admitting: Thoracic Surgery (Cardiothoracic Vascular Surgery)

## 2021-06-12 ENCOUNTER — Ambulatory Visit (HOSPITAL_COMMUNITY)
Admission: RE | Admit: 2021-06-12 | Discharge: 2021-06-12 | Disposition: A | Discharge status: Home / Self Care | Payer: BC Managed Care – PPO | Source: Ambulatory Visit | Attending: Thoracic Surgery (Cardiothoracic Vascular Surgery) | Admitting: Thoracic Surgery (Cardiothoracic Vascular Surgery)

## 2021-06-12 ENCOUNTER — Other Ambulatory Visit: Payer: Self-pay

## 2021-06-12 DIAGNOSIS — Z01818 Encounter for other preprocedural examination: Secondary | ICD-10-CM | POA: Diagnosis present

## 2021-06-12 DIAGNOSIS — I898 Other specified noninfective disorders of lymphatic vessels and lymph nodes: Secondary | ICD-10-CM | POA: Diagnosis not present

## 2021-06-12 DIAGNOSIS — R1310 Dysphagia, unspecified: Secondary | ICD-10-CM

## 2021-06-12 DIAGNOSIS — Z20822 Contact with and (suspected) exposure to covid-19: Secondary | ICD-10-CM | POA: Insufficient documentation

## 2021-06-12 LAB — TYPE AND SCREEN
ABO/RH(D): B POS
Antibody Screen: NEGATIVE

## 2021-06-12 LAB — URINALYSIS, ROUTINE W REFLEX MICROSCOPIC
Bilirubin Urine: NEGATIVE
Glucose, UA: NEGATIVE mg/dL
Ketones, ur: NEGATIVE mg/dL
Leukocytes,Ua: NEGATIVE
Nitrite: NEGATIVE
Protein, ur: NEGATIVE mg/dL
Specific Gravity, Urine: >1.03 — ABNORMAL HIGH (ref 1.005–1.030)
pH: 6 (ref 5.0–8.0)

## 2021-06-12 LAB — COMPREHENSIVE METABOLIC PANEL
ALT: 15 U/L (ref 0–44)
AST: 21 U/L (ref 15–41)
Albumin: 3.8 g/dL (ref 3.5–5.0)
Alkaline Phosphatase: 53 U/L (ref 38–126)
Anion gap: 11 (ref 5–15)
BUN: 15 mg/dL (ref 6–20)
CO2: 16 mmol/L — ABNORMAL LOW (ref 22–32)
Calcium: 8.7 mg/dL — ABNORMAL LOW (ref 8.9–10.3)
Chloride: 109 mmol/L (ref 98–111)
Creatinine, Ser: 0.75 mg/dL (ref 0.44–1.00)
GFR, Estimated: >60 mL/min (ref >60)
Glucose, Bld: 101 mg/dL — ABNORMAL HIGH (ref 70–99)
Potassium: 4 mmol/L (ref 3.5–5.1)
Sodium: 136 mmol/L (ref 135–145)
Total Bilirubin: 0.5 mg/dL (ref 0.3–1.2)
Total Protein: 6.4 g/dL — ABNORMAL LOW (ref 6.5–8.1)

## 2021-06-12 LAB — BLOOD GAS, ARTERIAL
Acid-base deficit: 5.7 mmol/L — ABNORMAL HIGH (ref 0.0–2.0)
Bicarbonate: 16.8 mmol/L — ABNORMAL LOW (ref 20.0–28.0)
Drawn by: 58793
FIO2: 21
O2 Saturation: 99.3 %
Patient temperature: 37
pCO2 arterial: 21.2 mmHg — ABNORMAL LOW (ref 32.0–48.0)
pH, Arterial: 7.51 — ABNORMAL HIGH (ref 7.350–7.450)
pO2, Arterial: 142 mmHg — ABNORMAL HIGH (ref 83.0–108.0)

## 2021-06-12 LAB — CBC
HCT: 41.2 % (ref 36.0–46.0)
Hemoglobin: 13.6 g/dL (ref 12.0–15.0)
MCH: 29.8 pg (ref 26.0–34.0)
MCHC: 33 g/dL (ref 30.0–36.0)
MCV: 90.2 fL (ref 80.0–100.0)
Platelets: 228 10*3/uL (ref 150–400)
RBC: 4.57 MIL/uL (ref 3.87–5.11)
RDW: 12.4 % (ref 11.5–15.5)
WBC: 5.4 10*3/uL (ref 4.0–10.5)
nRBC: 0 % (ref 0.0–0.2)

## 2021-06-12 LAB — SARS CORONAVIRUS 2 (TAT 6-24 HRS): SARS Coronavirus 2: NEGATIVE

## 2021-06-12 LAB — URINALYSIS, MICROSCOPIC (REFLEX)

## 2021-06-12 LAB — PROTIME-INR
INR: 1 (ref 0.8–1.2)
Prothrombin Time: 12.9 seconds (ref 11.4–15.2)

## 2021-06-12 LAB — APTT: aPTT: 30 seconds (ref 24–36)

## 2021-06-12 LAB — SURGICAL PCR SCREEN
MRSA, PCR: NEGATIVE
Staphylococcus aureus: NEGATIVE

## 2021-06-12 NOTE — Progress Notes (Signed)
PCP - Dr. Harlan Stains Cardiologist - Denies, Only sees [facility] due to surgical needs on esophagus  Chest x-ray - 06/12/21 EKG - 06/12/21 Stress Test - Denies ECHO - Denies Cardiac Cath - Denies  Sleep Study - Denies  DM - Denies  COVID TEST- 06/12/21  Anesthesia review: No  Patient denies shortness of breath, fever, cough and chest pain at PAT appointment   All instructions explained to the patient, with a verbal understanding of the material. Patient agrees to go over the instructions while at home for a better understanding. Patient also instructed to wear a mask while in public after being tested for COVID-19. The opportunity to ask questions was provided.

## 2021-06-14 ENCOUNTER — Ambulatory Visit (HOSPITAL_COMMUNITY): Payer: BC Managed Care – PPO | Admitting: Certified Registered Nurse Anesthetist

## 2021-06-14 ENCOUNTER — Encounter (HOSPITAL_COMMUNITY): Payer: Self-pay | Admitting: Thoracic Surgery (Cardiothoracic Vascular Surgery)

## 2021-06-14 ENCOUNTER — Encounter (HOSPITAL_COMMUNITY)
Admission: RE | Disposition: A | Discharge status: Home / Self Care | Payer: Self-pay | Source: Home / Self Care | Attending: Thoracic Surgery (Cardiothoracic Vascular Surgery)

## 2021-06-14 ENCOUNTER — Other Ambulatory Visit: Payer: Self-pay

## 2021-06-14 ENCOUNTER — Observation Stay (HOSPITAL_COMMUNITY)
Admission: RE | Admit: 2021-06-14 | Discharge: 2021-06-15 | Disposition: A | Discharge status: Home / Self Care | Payer: BC Managed Care – PPO | Attending: Thoracic Surgery (Cardiothoracic Vascular Surgery) | Admitting: Thoracic Surgery (Cardiothoracic Vascular Surgery)

## 2021-06-14 DIAGNOSIS — Z9889 Other specified postprocedural states: Secondary | ICD-10-CM | POA: Insufficient documentation

## 2021-06-14 DIAGNOSIS — R1310 Dysphagia, unspecified: Principal | ICD-10-CM | POA: Insufficient documentation

## 2021-06-14 DIAGNOSIS — K9189 Other postprocedural complications and disorders of digestive system: Secondary | ICD-10-CM | POA: Diagnosis not present

## 2021-06-14 HISTORY — PX: ESOPHAGOGASTRODUODENOSCOPY: SHX5428

## 2021-06-14 LAB — POCT PREGNANCY, URINE: Preg Test, Ur: NEGATIVE

## 2021-06-14 SURGERY — THORACOSCOPY, ROBOT-ASSISTED
Anesthesia: General | Site: Chest

## 2021-06-14 MED ORDER — CEFAZOLIN SODIUM-DEXTROSE 2-4 GM/100ML-% IV SOLN
2.0000 g | Freq: Three times a day (TID) | INTRAVENOUS | Status: AC
  Administered 2021-06-14: 2 g via INTRAVENOUS
  Filled 2021-06-14: qty 100

## 2021-06-14 MED ORDER — FENTANYL CITRATE PF 50 MCG/ML IJ SOSY
12.5000 ug | PREFILLED_SYRINGE | INTRAMUSCULAR | Status: DC | PRN
Start: 2021-06-14 — End: 2021-06-15
  Administered 2021-06-14 – 2021-06-15 (×3): 12.5 ug via INTRAVENOUS
  Filled 2021-06-14 (×3): qty 1

## 2021-06-14 MED ORDER — DEXAMETHASONE SODIUM PHOSPHATE 10 MG/ML IJ SOLN
INTRAMUSCULAR | Status: AC
  Filled 2021-06-14: qty 1

## 2021-06-14 MED ORDER — CEFAZOLIN SODIUM-DEXTROSE 2-4 GM/100ML-% IV SOLN
2.0000 g | INTRAVENOUS | Status: AC
  Administered 2021-06-14: 2 g via INTRAVENOUS

## 2021-06-14 MED ORDER — CHLORHEXIDINE GLUCONATE 0.12 % MT SOLN
OROMUCOSAL | Status: AC
  Administered 2021-06-14: 15 mL via OROMUCOSAL
  Filled 2021-06-14: qty 15

## 2021-06-14 MED ORDER — LACTATED RINGERS IV SOLN: INTRAVENOUS | Status: DC | PRN

## 2021-06-14 MED ORDER — CHLORHEXIDINE GLUCONATE 0.12 % MT SOLN: 15.0000 mL | Freq: Once | OROMUCOSAL | Status: AC

## 2021-06-14 MED ORDER — ROCURONIUM BROMIDE 10 MG/ML (PF) SYRINGE
PREFILLED_SYRINGE | INTRAVENOUS | Status: AC
  Filled 2021-06-14: qty 10

## 2021-06-14 MED ORDER — FENTANYL CITRATE (PF) 100 MCG/2ML IJ SOLN: 25.0000 ug | INTRAMUSCULAR | Status: DC | PRN

## 2021-06-14 MED ORDER — SCOPOLAMINE 1 MG/3DAYS TD PT72
MEDICATED_PATCH | TRANSDERMAL | Status: AC
  Filled 2021-06-14: qty 1

## 2021-06-14 MED ORDER — ONDANSETRON HCL 4 MG/2ML IJ SOLN
INTRAMUSCULAR | Status: AC
  Filled 2021-06-14: qty 2

## 2021-06-14 MED ORDER — KETOROLAC TROMETHAMINE 30 MG/ML IJ SOLN
30.0000 mg | Freq: Four times a day (QID) | INTRAMUSCULAR | Status: DC | PRN
  Administered 2021-06-15: 30 mg via INTRAVENOUS
  Filled 2021-06-14: qty 1

## 2021-06-14 MED ORDER — PROPOFOL 10 MG/ML IV BOLUS
INTRAVENOUS | Status: DC | PRN
  Administered 2021-06-14: 20 mg via INTRAVENOUS
  Administered 2021-06-14: 130 mg via INTRAVENOUS

## 2021-06-14 MED ORDER — SUGAMMADEX SODIUM 200 MG/2ML IV SOLN
INTRAVENOUS | Status: DC | PRN
  Administered 2021-06-14: 200 mg via INTRAVENOUS

## 2021-06-14 MED ORDER — 0.9 % SODIUM CHLORIDE (POUR BTL) OPTIME
TOPICAL | Status: DC | PRN
  Administered 2021-06-14: 2000 mL
  Administered 2021-06-14: 500 mL

## 2021-06-14 MED ORDER — BUPIVACAINE LIPOSOME 1.3 % IJ SUSP
INTRAMUSCULAR | Status: DC | PRN
  Administered 2021-06-14: 50 mL

## 2021-06-14 MED ORDER — LIDOCAINE 2% (20 MG/ML) 5 ML SYRINGE
INTRAMUSCULAR | Status: DC | PRN
  Administered 2021-06-14: 60 mg via INTRAVENOUS

## 2021-06-14 MED ORDER — PROPOFOL 10 MG/ML IV BOLUS
INTRAVENOUS | Status: AC
  Filled 2021-06-14: qty 20

## 2021-06-14 MED ORDER — SCOPOLAMINE 1 MG/3DAYS TD PT72
1.0000 | MEDICATED_PATCH | TRANSDERMAL | Status: DC
  Administered 2021-06-14: 1.5 mg via TRANSDERMAL

## 2021-06-14 MED ORDER — SUMATRIPTAN 20 MG/ACT NA SOLN: 20.0000 mg | NASAL | Status: DC | PRN

## 2021-06-14 MED ORDER — PHENYLEPHRINE 40 MCG/ML (10ML) SYRINGE FOR IV PUSH (FOR BLOOD PRESSURE SUPPORT)
PREFILLED_SYRINGE | INTRAVENOUS | Status: DC | PRN
  Administered 2021-06-14 (×3): 80 ug via INTRAVENOUS

## 2021-06-14 MED ORDER — SODIUM CHLORIDE 0.45 % IV SOLN: INTRAVENOUS | Status: DC

## 2021-06-14 MED ORDER — ORAL CARE MOUTH RINSE: 15.0000 mL | Freq: Once | OROMUCOSAL | Status: AC

## 2021-06-14 MED ORDER — BUPIVACAINE HCL (PF) 0.5 % IJ SOLN
INTRAMUSCULAR | Status: AC
  Filled 2021-06-14: qty 30

## 2021-06-14 MED ORDER — SUMATRIPTAN 20 MG/ACT NA SOLN
20.0000 mg | NASAL | Status: DC | PRN
  Administered 2021-06-14: 20 mg via NASAL
  Filled 2021-06-14: qty 1

## 2021-06-14 MED ORDER — ROCURONIUM BROMIDE 10 MG/ML (PF) SYRINGE
PREFILLED_SYRINGE | INTRAVENOUS | Status: DC | PRN
  Administered 2021-06-14: 30 mg via INTRAVENOUS
  Administered 2021-06-14: 20 mg via INTRAVENOUS
  Administered 2021-06-14: 40 mg via INTRAVENOUS
  Administered 2021-06-14: 50 mg via INTRAVENOUS

## 2021-06-14 MED ORDER — BUPIVACAINE LIPOSOME 1.3 % IJ SUSP
INTRAMUSCULAR | Status: AC
  Filled 2021-06-14: qty 20

## 2021-06-14 MED ORDER — MIDAZOLAM HCL 2 MG/2ML IJ SOLN
INTRAMUSCULAR | Status: AC
  Filled 2021-06-14: qty 2

## 2021-06-14 MED ORDER — LACTATED RINGERS IV SOLN: INTRAVENOUS | Status: DC

## 2021-06-14 MED ORDER — FENTANYL CITRATE (PF) 250 MCG/5ML IJ SOLN
INTRAMUSCULAR | Status: DC | PRN
  Administered 2021-06-14 (×2): 50 ug via INTRAVENOUS
  Administered 2021-06-14: 100 ug via INTRAVENOUS

## 2021-06-14 MED ORDER — ONDANSETRON HCL 4 MG/2ML IJ SOLN
4.0000 mg | Freq: Four times a day (QID) | INTRAMUSCULAR | Status: DC
  Administered 2021-06-14 – 2021-06-15 (×2): 4 mg via INTRAVENOUS
  Filled 2021-06-14 (×3): qty 2

## 2021-06-14 MED ORDER — APREPITANT 40 MG PO CAPS: 40.0000 mg | ORAL_CAPSULE | Freq: Once | ORAL | Status: DC

## 2021-06-14 MED ORDER — MIDAZOLAM HCL 5 MG/5ML IJ SOLN
INTRAMUSCULAR | Status: DC | PRN
  Administered 2021-06-14: 2 mg via INTRAVENOUS

## 2021-06-14 MED ORDER — PHENYLEPHRINE HCL-NACL 20-0.9 MG/250ML-% IV SOLN
INTRAVENOUS | Status: DC | PRN
  Administered 2021-06-14: 20 ug/min via INTRAVENOUS

## 2021-06-14 MED ORDER — CEFAZOLIN SODIUM-DEXTROSE 2-4 GM/100ML-% IV SOLN
INTRAVENOUS | Status: AC
  Filled 2021-06-14: qty 100

## 2021-06-14 MED ORDER — PHENYLEPHRINE HCL-NACL 20-0.9 MG/250ML-% IV SOLN: INTRAVENOUS | Status: DC | PRN

## 2021-06-14 MED ORDER — AMISULPRIDE (ANTIEMETIC) 5 MG/2ML IV SOLN: 10.0000 mg | Freq: Once | INTRAVENOUS | Status: DC | PRN

## 2021-06-14 MED ORDER — ACETAMINOPHEN 10 MG/ML IV SOLN
1000.0000 mg | Freq: Once | INTRAVENOUS | Status: DC | PRN
Start: 2021-06-14 — End: 2021-06-14

## 2021-06-14 MED ORDER — FENTANYL CITRATE (PF) 250 MCG/5ML IJ SOLN
INTRAMUSCULAR | Status: AC
  Filled 2021-06-14: qty 5

## 2021-06-14 MED ORDER — DEXAMETHASONE SODIUM PHOSPHATE 10 MG/ML IJ SOLN
INTRAMUSCULAR | Status: DC | PRN
  Administered 2021-06-14: 4 mg via INTRAVENOUS

## 2021-06-14 MED ORDER — ONDANSETRON HCL 4 MG/2ML IJ SOLN
INTRAMUSCULAR | Status: DC | PRN
  Administered 2021-06-14: 4 mg via INTRAVENOUS

## 2021-06-14 SURGICAL SUPPLY — 122 items
ADH SKN CLS APL DERMABOND .7 (GAUZE/BANDAGES/DRESSINGS) ×2
APL PRP STRL LF DISP 70% ISPRP (MISCELLANEOUS) ×2
BLADE CLIPPER SURG (BLADE) ×2 IMPLANT
BNDG COHESIVE 6X5 TAN STRL LF (GAUZE/BANDAGES/DRESSINGS) ×3 IMPLANT
BUTTON OLYMPUS DEFENDO 5 PIECE (MISCELLANEOUS) ×3 IMPLANT
CANISTER SUCT 3000ML PPV (MISCELLANEOUS) ×6 IMPLANT
CANNULA REDUC XI 12-8 STAPL (CANNULA) ×6
CANNULA REDUCER 12-8 DVNC XI (CANNULA) ×4 IMPLANT
CATH THORACIC 28FR (CATHETERS) IMPLANT
CATH THORACIC 28FR RT ANG (CATHETERS) IMPLANT
CATH THORACIC 36FR (CATHETERS) IMPLANT
CATH THORACIC 36FR RT ANG (CATHETERS) IMPLANT
CATH TROCAR 20FR (CATHETERS) IMPLANT
CHLORAPREP W/TINT 26 (MISCELLANEOUS) ×3 IMPLANT
CLIP VESOCCLUDE MED 6/CT (CLIP) IMPLANT
CNTNR URN SCR LID CUP LEK RST (MISCELLANEOUS) ×4 IMPLANT
CONN ST 1/4X3/8  BEN (MISCELLANEOUS)
CONN ST 1/4X3/8 BEN (MISCELLANEOUS) IMPLANT
CONT SPEC 4OZ STRL OR WHT (MISCELLANEOUS) ×3
COVER BACK TABLE 60X90IN (DRAPES) ×2 IMPLANT
COVER SURGICAL LIGHT HANDLE (MISCELLANEOUS) IMPLANT
DECANTER SPIKE VIAL GLASS SM (MISCELLANEOUS) ×2 IMPLANT
DEFOGGER SCOPE WARMER CLEARIFY (MISCELLANEOUS) ×3 IMPLANT
DERMABOND ADVANCED (GAUZE/BANDAGES/DRESSINGS) ×1
DERMABOND ADVANCED .7 DNX12 (GAUZE/BANDAGES/DRESSINGS) ×2 IMPLANT
DRAIN CHANNEL 28F RND 3/8 FF (WOUND CARE) IMPLANT
DRAIN CHANNEL 32F RND 10.7 FF (WOUND CARE) IMPLANT
DRAPE ARM DVNC X/XI (DISPOSABLE) ×8 IMPLANT
DRAPE COLUMN DVNC XI (DISPOSABLE) ×2 IMPLANT
DRAPE CV SPLIT W-CLR ANES SCRN (DRAPES) ×3 IMPLANT
DRAPE DA VINCI XI ARM (DISPOSABLE) ×12
DRAPE DA VINCI XI COLUMN (DISPOSABLE) ×3
DRAPE ORTHO SPLIT 77X108 STRL (DRAPES) ×3
DRAPE SURG ORHT 6 SPLT 77X108 (DRAPES) ×2 IMPLANT
ELECT BLADE 6.5 EXT (BLADE) IMPLANT
ELECT REM PT RETURN 9FT ADLT (ELECTROSURGICAL) ×3
ELECTRODE REM PT RTRN 9FT ADLT (ELECTROSURGICAL) ×2 IMPLANT
FORCEPS GRASP COMBO 8X230 (FORCEP) ×3 IMPLANT
GAUZE KITTNER 4X5 RF (MISCELLANEOUS) ×3 IMPLANT
GAUZE SPONGE 4X4 12PLY STRL (GAUZE/BANDAGES/DRESSINGS) ×3 IMPLANT
GLOVE SURG ENC MOIS LTX SZ7 (GLOVE) ×3 IMPLANT
GLOVE SURG ENC MOIS LTX SZ7.5 (GLOVE) ×6 IMPLANT
GOWN STRL REUS W/ TWL LRG LVL3 (GOWN DISPOSABLE) ×4 IMPLANT
GOWN STRL REUS W/ TWL XL LVL3 (GOWN DISPOSABLE) ×6 IMPLANT
GOWN STRL REUS W/TWL 2XL LVL3 (GOWN DISPOSABLE) ×3 IMPLANT
GOWN STRL REUS W/TWL LRG LVL3 (GOWN DISPOSABLE) ×6
GOWN STRL REUS W/TWL XL LVL3 (GOWN DISPOSABLE) ×9
HEMOSTAT SURGICEL 2X14 (HEMOSTASIS) ×7 IMPLANT
IRRIGATION STRYKERFLOW (MISCELLANEOUS) ×2 IMPLANT
IRRIGATOR STRYKERFLOW (MISCELLANEOUS) ×3
IRRIGATOR SUCT 8 DISP DVNC XI (IRRIGATION / IRRIGATOR) IMPLANT
IRRIGATOR SUCTION 8MM XI DISP (IRRIGATION / IRRIGATOR) ×3
KIT BASIN OR (CUSTOM PROCEDURE TRAY) ×3 IMPLANT
KIT SUCTION CATH 14FR (SUCTIONS) IMPLANT
KIT TURNOVER KIT B (KITS) ×3 IMPLANT
LOOP VESSEL SUPERMAXI WHITE (MISCELLANEOUS) IMPLANT
MARKER SKIN DUAL TIP RULER LAB (MISCELLANEOUS) ×3 IMPLANT
NDL HYPO 25GX1X1/2 BEV (NEEDLE) ×2 IMPLANT
NDL SCLEROTHERAPY 23X2X240 (NEEDLE) IMPLANT
NEEDLE 22X1 1/2 (OR ONLY) (NEEDLE) ×3 IMPLANT
NEEDLE HYPO 25GX1X1/2 BEV (NEEDLE) ×3 IMPLANT
NEEDLE SCLEROTHERAPY 23X2X240 (NEEDLE) IMPLANT
NS IRRIG 1000ML POUR BTL (IV SOLUTION) ×9 IMPLANT
OBTURATOR OPTICAL STANDARD 8MM (TROCAR)
OBTURATOR OPTICAL STND 8 DVNC (TROCAR)
OBTURATOR OPTICALSTD 8 DVNC (TROCAR) IMPLANT
OIL SILICONE PENTAX (PARTS (SERVICE/REPAIRS)) IMPLANT
PACK CHEST (CUSTOM PROCEDURE TRAY) ×3 IMPLANT
PACK UNIVERSAL I (CUSTOM PROCEDURE TRAY) ×2 IMPLANT
PAD ARMBOARD 7.5X6 YLW CONV (MISCELLANEOUS) ×15 IMPLANT
PORT ACCESS TROCAR AIRSEAL 12 (TROCAR) ×2 IMPLANT
PORT ACCESS TROCAR AIRSEAL 5M (TROCAR) ×1
SEAL CANN UNIV 5-8 DVNC XI (MISCELLANEOUS) ×4 IMPLANT
SEAL XI 5MM-8MM UNIVERSAL (MISCELLANEOUS) ×6
SEALANT PROGEL (MISCELLANEOUS) IMPLANT
SEALANT SURG COSEAL 4ML (VASCULAR PRODUCTS) IMPLANT
SEALANT SURG COSEAL 8ML (VASCULAR PRODUCTS) IMPLANT
SET TRI-LUMEN FLTR TB AIRSEAL (TUBING) ×3 IMPLANT
SOLUTION ELECTROLUBE (MISCELLANEOUS) IMPLANT
SPONGE INTESTINAL PEANUT (DISPOSABLE) IMPLANT
STAPLER CANNULA SEAL DVNC XI (STAPLE) ×4 IMPLANT
STAPLER CANNULA SEAL XI (STAPLE) ×6
STOPCOCK 4 WAY LG BORE MALE ST (IV SETS) ×2 IMPLANT
SUT MON AB 2-0 CT1 36 (SUTURE) IMPLANT
SUT PDS AB 1 CTX 36 (SUTURE) IMPLANT
SUT PROLENE 4 0 RB 1 (SUTURE)
SUT PROLENE 4-0 RB1 .5 CRCL 36 (SUTURE) IMPLANT
SUT SILK  1 MH (SUTURE)
SUT SILK 1 MH (SUTURE) ×2 IMPLANT
SUT SILK 1 TIES 10X30 (SUTURE) IMPLANT
SUT SILK 2 0 SH (SUTURE) IMPLANT
SUT SILK 2 0SH CR/8 30 (SUTURE) IMPLANT
SUT V-LOC BARB 180 2/0GR6 GS22 (SUTURE) ×3
SUT VIC AB 1 CTX 36 (SUTURE)
SUT VIC AB 1 CTX36XBRD ANBCTR (SUTURE) IMPLANT
SUT VIC AB 2-0 CT1 27 (SUTURE)
SUT VIC AB 2-0 CT1 TAPERPNT 27 (SUTURE) ×2 IMPLANT
SUT VIC AB 3-0 SH 27 (SUTURE) ×6
SUT VIC AB 3-0 SH 27X BRD (SUTURE) ×6 IMPLANT
SUT VICRYL 0 TIES 12 18 (SUTURE) ×3 IMPLANT
SUT VICRYL 0 UR6 27IN ABS (SUTURE) ×6 IMPLANT
SUT VICRYL 2 TP 1 (SUTURE) IMPLANT
SUTURE V-LC BRB 180 2/0GR6GS22 (SUTURE) IMPLANT
SYR 10ML LL (SYRINGE) ×3 IMPLANT
SYR 20ML ECCENTRIC (SYRINGE) ×3 IMPLANT
SYR 20ML LL LF (SYRINGE) ×3 IMPLANT
SYR 30ML SLIP (SYRINGE) ×3 IMPLANT
SYR 50ML LL SCALE MARK (SYRINGE) ×3 IMPLANT
SYSTEM SAHARA CHEST DRAIN ATS (WOUND CARE) ×3 IMPLANT
TAPE CLOTH 4X10 WHT NS (GAUZE/BANDAGES/DRESSINGS) ×3 IMPLANT
TIP APPLICATOR SPRAY EXTEND 16 (VASCULAR PRODUCTS) IMPLANT
TOWEL GREEN STERILE (TOWEL DISPOSABLE) ×3 IMPLANT
TOWEL GREEN STERILE FF (TOWEL DISPOSABLE) ×3 IMPLANT
TRAY FOLEY MTR SLVR 14FR STAT (SET/KITS/TRAYS/PACK) ×1 IMPLANT
TROCAR BLADELESS 15MM (ENDOMECHANICALS) IMPLANT
TROCAR PORT AIRSEAL 8X120 (TROCAR) ×1 IMPLANT
TROCAR XCEL BLADELESS 5X75MML (TROCAR) ×1 IMPLANT
TUBE CONNECTING 20X1/4 (TUBING) ×3 IMPLANT
TUBING ENDO SMARTCAP (MISCELLANEOUS) ×3 IMPLANT
TUBING EXTENTION W/L.L. (IV SETS) ×3 IMPLANT
UNDERPAD 30X36 HEAVY ABSORB (UNDERPADS AND DIAPERS) ×3 IMPLANT
WATER STERILE IRR 1000ML POUR (IV SOLUTION) ×3 IMPLANT

## 2021-06-14 NOTE — Anesthesia Preprocedure Evaluation (Addendum)
Anesthesia Evaluation  Patient identified by MRN, date of birth, ID band Patient awake    Reviewed: Allergy & Precautions, NPO status , Patient's Chart, lab work & pertinent test results  History of Anesthesia Complications (+) PONV  Airway Mallampati: II  TM Distance: >3 FB Neck ROM: Full    Dental  (+) Teeth Intact   Pulmonary neg pulmonary ROS,    Pulmonary exam normal        Cardiovascular negative cardio ROS   Rhythm:Regular Rate:Normal     Neuro/Psych  Headaches, Anxiety Depression    GI/Hepatic Neg liver ROS, hiatal hernia, GERD  Medicated,Dysphagia with need for revision of fundoplication   Endo/Other  negative endocrine ROS  Renal/GU negative Renal ROS  negative genitourinary   Musculoskeletal  (+) Arthritis , Osteoarthritis,    Abdominal (+)  Abdomen: soft.    Peds  Hematology negative hematology ROS (+)   Anesthesia Other Findings   Reproductive/Obstetrics                             Anesthesia Physical Anesthesia Plan  ASA: 3  Anesthesia Plan: General   Post-op Pain Management:    Induction: Intravenous  PONV Risk Score and Plan: 4 or greater and Ondansetron, Aprepitant and Scopolamine patch - Pre-op  Airway Management Planned: Mask and Oral ETT  Additional Equipment: None  Intra-op Plan:   Post-operative Plan: Extubation in OR  Informed Consent: I have reviewed the patients History and Physical, chart, labs and discussed the procedure including the risks, benefits and alternatives for the proposed anesthesia with the patient or authorized representative who has indicated his/her understanding and acceptance.     Dental advisory given  Plan Discussed with: CRNA  Anesthesia Plan Comments: (Lab Results      Component                Value               Date                      WBC                      5.4                 06/12/2021                HGB                       13.6                06/12/2021                HCT                      41.2                06/12/2021                MCV                      90.2                06/12/2021                PLT  228                 06/12/2021           Lab Results      Component                Value               Date                      NA                       136                 06/12/2021                K                        4.0                 06/12/2021                CO2                      16 (L)              06/12/2021                GLUCOSE                  101 (H)             06/12/2021                BUN                      15                  06/12/2021                CREATININE               0.75                06/12/2021                CALCIUM                  8.7 (L)             06/12/2021                GFRNONAA                 >60                 06/12/2021          )      Anesthesia Quick Evaluation

## 2021-06-14 NOTE — Plan of Care (Signed)

## 2021-06-14 NOTE — Anesthesia Postprocedure Evaluation (Signed)
Anesthesia Post Note  Patient: Kathleen Bird  Procedure(s) Performed: XI ROBOTIC LAPAROSCOPY REVISION OF FUNDOPLICATION (Chest) ESOPHAGOGASTRODUODENOSCOPY (EGD)     Patient location during evaluation: PACU Anesthesia Type: General Level of consciousness: awake and alert Pain management: pain level controlled Vital Signs Assessment: post-procedure vital signs reviewed and stable Respiratory status: spontaneous breathing, nonlabored ventilation, respiratory function stable and patient connected to nasal cannula oxygen Cardiovascular status: blood pressure returned to baseline and stable Postop Assessment: no apparent nausea or vomiting Anesthetic complications: no   No notable events documented.  Last Vitals:  Vitals:   06/14/21 1656 06/14/21 1720  BP: 103/68 100/66  Pulse: 79 79  Resp: 17 17  Temp: (!) 36.3 C 36.5 C  SpO2: 95% 95%    Last Pain:  Vitals:   06/14/21 1720  TempSrc:   PainSc: 0-No pain                 Belenda Cruise P Graceyn Fodor

## 2021-06-14 NOTE — Transfer of Care (Signed)
Immediate Anesthesia Transfer of Care Note  Patient: Kathleen Bird  Procedure(s) Performed: XI ROBOTIC LAPAROSCOPY REVISION OF FUNDOPLICATION (Chest) ESOPHAGOGASTRODUODENOSCOPY (EGD)  Patient Location: PACU  Anesthesia Type:General  Level of Consciousness: drowsy, patient cooperative and responds to stimulation  Airway & Oxygen Therapy: Patient Spontanous Breathing  Post-op Assessment: Report given to RN, Post -op Vital signs reviewed and stable and Patient moving all extremities X 4  Post vital signs: Reviewed and stable  Last Vitals:  Vitals Value Taken Time  BP 107/65 06/14/21 1611  Temp    Pulse 92 06/14/21 1613  Resp 18 06/14/21 1613  SpO2 94 % 06/14/21 1613  Vitals shown include unvalidated device data.  Last Pain:  Vitals:   06/14/21 1027  TempSrc:   PainSc: 0-No pain         Complications: No notable events documented.

## 2021-06-14 NOTE — Anesthesia Procedure Notes (Signed)
Procedure Name: Intubation Date/Time: 06/14/2021 2:06 PM Performed by: Lowella Dell, CRNA Pre-anesthesia Checklist: Patient identified, Emergency Drugs available, Suction available and Patient being monitored Patient Re-evaluated:Patient Re-evaluated prior to induction Oxygen Delivery Method: Circle System Utilized Preoxygenation: Pre-oxygenation with 100% oxygen Induction Type: IV induction Ventilation: Mask ventilation without difficulty Laryngoscope Size: Mac and 3 Grade View: Grade I Tube type: Oral Tube size: 7.0 mm Number of attempts: 1 Airway Equipment and Method: Stylet Placement Confirmation: ETT inserted through vocal cords under direct vision, positive ETCO2 and breath sounds checked- equal and bilateral Secured at: 22 cm Tube secured with: Tape Dental Injury: Teeth and Oropharynx as per pre-operative assessment

## 2021-06-14 NOTE — Interval H&P Note (Signed)
History and Physical Interval Note:  06/14/2021 1:38 PM  EVALISE ABRUZZESE  has presented today for surgery, with the diagnosis of DYSPHAGIA.  The various methods of treatment have been discussed with the patient and family. After consideration of risks, benefits and other options for treatment, the patient has consented to  Procedure(s): XI ROBOTIC LAPAROSCOPY REVISION OF FUNDOPLICATION (N/A) ESOPHAGOGASTRODUODENOSCOPY (EGD) (N/A) as a surgical intervention.  The patient's history has been reviewed, patient examined, no change in status, stable for surgery.  I have reviewed the patient's chart and labs.  Questions were answered to the patient's satisfaction.    Vitals:   06/14/21 1011  BP: 113/84  Pulse: 80  Resp: 18  Temp: 98.3 F (36.8 C)  SpO2: 97%   Alert NAD Sinus EWOB  Edilson Vital O Shelley Cocke

## 2021-06-14 NOTE — Brief Op Note (Addendum)
06/14/2021  3:49 PM  PATIENT:  [name]  45 y.o. female  PRE-OPERATIVE DIAGNOSIS:  DYSPHAGIA  POST-OPERATIVE DIAGNOSIS:  DYSPHAGIA  PROCEDURE:   XI ROBOTIC LAPAROSCOPY REVISION OF FUNDOPLICATION  ESOPHAGOGASTRODUODENOSCOPY (EGD) (N/A)  SURGEON: [facility], Lucile Crater, MD - Primary  PHYSICIAN ASSISTANT: Maison Kestenbaum  ASSISTANTS: Buddy Duty, CST, Scrub Person   ANESTHESIA:   general  EBL: 59ml  BLOOD ADMINISTERED:none  DRAINS: none   LOCAL MEDICATIONS USED:  Exparel local  SPECIMEN:  No Specimen  DISPOSITION OF SPECIMEN:  N/A  COUNTS:  YES  DICTATION: .Dragon Dictation  PLAN OF CARE: Admit to inpatient   PATIENT DISPOSITION:  PACU - hemodynamically stable.   Delay start of Pharmacological VTE agent (>24hrs) due to surgical blood loss or risk of bleeding: yes

## 2021-06-14 NOTE — Op Note (Signed)
[address]       [place] [address]             [phone]        06/14/2021  Patient:  Kathleen Bird Pre-Op Dx: Dysphagia   Status post hiatal hernia repair with Dor Fundoplication   Status postresection of a esophageal duplication cyst. Post-op Dx: Same Procedure: - Esophagoscopy - Robotic assisted laparoscopy -Takedown of fundoplication. Surgeon and Role:      * [name], MD - Primary    *M. Roddenberry, PA-C- assisting  Anesthesia  general EBL: Minimal  Blood Administration: None Specimen: None   Counts: correct   Indications: This is a 45 year old female who underwent robotic assisted right thoracoscopy with resection of a duplication cyst followed by hiatal hernia repair with Dor fundoplication in August 5784.  Unfortunately she has had persistent dysphagia to solids.  She underwent an esophagram which showed passage of contrast but an obstruction at the GE junction to the pills.  She also had some mild distention of her distal esophagus.  During the time of the original procedure with a hiatal hernia repair and fundoplication was performed the gastroscope was used as our bougie however due to the duplication cyst removal and disruption of the muscular layers of the distal esophagus I am concerned that the wrap may have been too tight that she was brought to the operating theater for takedown of the location  Findings: Bone esophagoscopy the esophagus appeared normal.  There was some retained food at the GE junction.  I was able to traverse the GE junction without any difficulty.  I was widely patent with insufflation.  At the completion of the takedown of the fundoplication the GE junction was still very widely patent.  Operative Technique: After the risks, benefits and alternatives were thoroughly discussed, the patient was brought to the operative theatre.  Anesthesia was induced, and the esophagoscope was passed through the  oropharynx down to the stomach.  The scope was retroflexed and the hiatal hernia was clearly evident.  The scope was pulled back and the mucosal surface of the esophagus was visualized.  The scope was then parked in the stomach.  The patient was then prepped and draped in normal sterile fashion.  An appropriate surgical pause was performed, and pre-operative antibiotics were dosed accordingly.  We began with a 1 cm incision 15 cm caudad from the xiphoid and slightly lateral to the umbilicus.  Using an Optiview we entered the peritoneal space.  The abdomen was then insufflated with CO2.  3 other robotic ports were placed to triangulate the hiatus.  Another 8 mm port was placed in place at the level of the umbilicus laterally for an assistant port and another 5 mm trocar was placed in the right lower quadrant for liver retractor.  The patient was then placed in steep reverse Trendelenburg and the liver was elevated to expose the esophageal hiatus.  And then the robot was docked.  We focused our attention to the hiatus.  The stomach was wrapped anterior to the esophagus.  The sutures were identified that were attached to the crus and these were all taken down.  I again evaluated the GE junction endoscopically widely patent.    On laparoscopy, there was a small area in the stomach where the wrap was taken down that had small serosal tear.  I oversewed this with a V-Loc suture.  An air leak test was performed using  the gastroscope.  No leak was evident.  The liver retractor was removed and all ports were removed under direct visualization.  The skin and soft tissue were closed with absorbable suture    The patient tolerated the procedure without any immediate complications, and was transferred to the PACU in stable condition.  Saramarie Stinger Bary Leriche

## 2021-06-14 NOTE — Hospital Course (Addendum)
History of Present Illness: Ms. Kathleen Bird is a 45 year old female with history of hiatal hernia she is status post robotic assisted laparoscopy for repair of the hiatal hernia with fundoplication last month.  During follow-up, she has continued to experience dysphagia.  Esophagram showed that she was able to clear liquids was unable to swallow pill.  Dr. Kipp Brood recommended revision of the repair.  Course in Hospital: Ms. Kathleen Bird was admitted for same-day surgery on 06/14/2021.  She was prepared and taken to the operating room where consisting of revision of the fundoplication was carried out along with esophagogastroduodenoscopy.  Please see the operative note for details.  Following the procedure, she was transferred to the postanesthesia care unit.  Postoperative hospital course:  The patient is remained quite stable.  An esophagram was performed on the morning of postoperative day #1.  It showed no leak, distal esophagus has a dilated appearance except for the distal most 1.7 cm, some narrowing may be due to swelling. As discussed with Dr. Kipp Brood, will start a mechanical soft diet. Patient states food only got " a little stuck" but much improved since last surgery. She denies nausea or vomiting. She has had shoulder pain since the surgery and still has (likely related to surgery, insufflation);should improve with time. Wounds are clean and dry. Patient wishes to go home. She still has Roxicodone liquid for pain and does not need a refill. She is surgically stable for discharge today.

## 2021-06-15 ENCOUNTER — Encounter (HOSPITAL_COMMUNITY): Payer: Self-pay | Admitting: Thoracic Surgery (Cardiothoracic Vascular Surgery)

## 2021-06-15 ENCOUNTER — Observation Stay (HOSPITAL_COMMUNITY): Payer: BC Managed Care – PPO

## 2021-06-15 DIAGNOSIS — R1310 Dysphagia, unspecified: Secondary | ICD-10-CM | POA: Diagnosis not present

## 2021-06-15 LAB — BASIC METABOLIC PANEL
Anion gap: 7 (ref 5–15)
BUN: 11 mg/dL (ref 6–20)
CO2: 21 mmol/L — ABNORMAL LOW (ref 22–32)
Calcium: 8.9 mg/dL (ref 8.9–10.3)
Chloride: 107 mmol/L (ref 98–111)
Creatinine, Ser: 0.85 mg/dL (ref 0.44–1.00)
GFR, Estimated: >60 mL/min (ref >60)
Glucose, Bld: 114 mg/dL — ABNORMAL HIGH (ref 70–99)
Potassium: 4.5 mmol/L (ref 3.5–5.1)
Sodium: 135 mmol/L (ref 135–145)

## 2021-06-15 LAB — CBC
HCT: 40.6 % (ref 36.0–46.0)
Hemoglobin: 13.8 g/dL (ref 12.0–15.0)
MCH: 30.4 pg (ref 26.0–34.0)
MCHC: 34 g/dL (ref 30.0–36.0)
MCV: 89.4 fL (ref 80.0–100.0)
Platelets: 223 10*3/uL (ref 150–400)
RBC: 4.54 MIL/uL (ref 3.87–5.11)
RDW: 12.1 % (ref 11.5–15.5)
WBC: 5.7 10*3/uL (ref 4.0–10.5)
nRBC: 0 % (ref 0.0–0.2)

## 2021-06-15 MED ORDER — DIATRIZOATE MEGLUMINE & SODIUM 66-10 % PO SOLN
120.0000 mL | Freq: Once | ORAL | Status: AC
  Administered 2021-06-15: 120 mL via ORAL
  Filled 2021-06-15: qty 120

## 2021-06-15 NOTE — Discharge Summary (Signed)
Physician Discharge Summary       [address]       [place] [address]             [phone]    Patient ID: Kathleen Bird MRN: [id number] DOB/AGE: 1975/12/04 45 y.o.  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 Post-op Dx: Same Procedure: - Esophagoscopy - Robotic assisted laparoscopy -Takedown of fundoplication by Dr. Kipp Brood on 06/14/2021.  Surgeon and Role:      * [name], MD - Primary    *M. Roddenberry, PA-C- assisting   History of Present Illness: Kathleen Bird is a 45 year old female with history of hiatal hernia she is status post robotic assisted laparoscopy for repair of the hiatal hernia with fundoplication last month.  During follow-up, she has continued to experience dysphagia.  Esophagram showed that she was able to clear liquids was unable to swallow pill.  Dr. Kipp Brood recommended revision of the repair.  Course in Hospital: Kathleen Bird was admitted for same-day surgery on 06/14/2021.  She was prepared and taken to the operating room where consisting of revision of the fundoplication was carried out along with esophagogastroduodenoscopy.   Please see the operative note for details.  Following the procedure, she was transferred to the postanesthesia care unit.  Postoperative hospital course: The patient is remained quite stable.  An esophagram was performed on the morning of postoperative day #1.  It showed no leak, distal esophagus has a dilated appearance except for the distal most 1.7 cm, some narrowing may be due to swelling. As discussed with Dr. Kipp Brood, will start a mechanical soft diet. Patient states food only got " a little stuck" but much improved since last surgery. She denies nausea or vomiting. She has had shoulder pain since the surgery and still has (likely related to surgery, insufflation);should improve with time. Wounds are clean and dry. Patient wishes to go home. She still has Roxicodone liquid for pain and does not need a refill. She is surgically stable for discharge today.  Latest Vital Signs: Blood pressure 114/72, pulse 99, temperature 98.1 F (36.7 C), temperature source Oral, resp. rate 12, height 5\' 3"  (1.6 m), weight 67.6 kg, last menstrual period 05/24/2021, SpO2 94 %.  Physical Exam: General appearance: alert, cooperative, and no distress Heart: regular rate and rhythm Lungs: clear to auscultation bilaterally Abdomen: minor incisional tenderness, + BS, non distended Extremities: no edema or calf tenderness Wound: healing well   Discharge Condition:Stable  Recent laboratory studies:  Lab Results  Component Value Date   WBC 5.7 06/15/2021  HGB 13.8 06/15/2021   HCT 40.6 06/15/2021   MCV 89.4 06/15/2021   PLT 223 06/15/2021   Lab Results  Component Value Date   NA 135 06/15/2021   K 4.5 06/15/2021   CL 107 06/15/2021   CO2 21 (L) 06/15/2021   CREATININE 0.85 06/15/2021   GLUCOSE 114 (H) 06/15/2021      Diagnostic Studies: DG Chest 2 View  Result Date: 06/13/2021 CLINICAL DATA:  Preop. EXAM: CHEST - 2 VIEW COMPARISON:  May 11, 2021.  April 10, 2021. FINDINGS: The heart size and  mediastinal contours are within normal limits. Bilateral hilar calcified lymph nodes are noted consistent with prior granulomatous disease. Scattered calcified granulomata are also noted in both lungs consistent with prior granulomatous disease. No acute abnormalities noted. The visualized skeletal structures are unremarkable. IMPRESSION: No active cardiopulmonary disease. Electronically Signed   By: Marijo Conception M.D.   On: 06/13/2021 10:51   DG ESOPHAGUS W DOUBLE CM (HD)  Result Date: 06/01/2021 CLINICAL DATA:  Paraesophageal hernia repair. Food getting stuck in throat. EXAM: ESOPHOGRAM / BARIUM SWALLOW / BARIUM TABLET STUDY TECHNIQUE: Combined double contrast and single contrast examination performed using effervescent crystals, thick barium liquid, and thin barium liquid. The patient was observed with fluoroscopy swallowing a 13 mm barium sulphate tablet. FLUOROSCOPY TIME:  Fluoroscopy Time:  2 minutes and 54 seconds. Radiation Exposure Index (if provided by the fluoroscopic device): 19 mGy Number of Acquired Spot Images: COMPARISON:  04/04/2021 FINDINGS: Frontal and lateral views of the hypopharynx while swallowing thick barium are unremarkable. Double contrast imaging of the esophagus shows patulous distal esophagus with gradual narrowing into the esophagogastric junction. Evaluation of esophageal motility shows preservation of primary peristalsis in the proximal and middle third of the esophagus with disruption of primary peristalsis in the distal third of the esophagus. No evidence for esophageal mass lesion. No mucosal ulceration or esophageal diverticulum. 13 mm barium tablet becomes lodged at the esophagogastric junction despite repeated swallows of thin barium and water. IMPRESSION: 1. Patulous appearance of the distal esophagus with gradual narrowing into the esophagogastric junction. 13 mm barium tablet becomes lodged at the esophagogastric junction despite repeated swallows of barium and water.  2. Disruption of primary peristalsis in the distal third of the esophagus. Electronically Signed   By: Misty Stanley M.D.   On: 06/01/2021 09:50   DG ESOPHAGUS W SINGLE CM (SOL OR THIN BA)  Result Date: 06/15/2021 CLINICAL DATA:  Postop day 1 status post fundoplication revision. EXAM: ESOPHOGRAM/BARIUM SWALLOW TECHNIQUE: Single contrast examination was performed using water-soluble contrast medium, followed by thin barium. FLUOROSCOPY TIME:  Fluoroscopy Time:  1 minute, 54 seconds Radiation Exposure Index (if provided by the fluoroscopic device): 15.4 mGy Number of Acquired Spot Images: 0 COMPARISON:  06/01/2021 FINDINGS: Again observed is a mildly dilated segment of the distal thoracic esophagus mildly overhanging the distal 1.7 cm of the esophagus as shown on image 17 series 21, almost resembling a broad distal esophageal diverticulum. In the vicinity of the GE junction there is narrowing compatible with fundoplication wrap, with contrast trickling through this region measuring up to about 0.4 cm in diameter. No leak was identified with water-soluble contrast medium, so I proceeded to administer the patient thin barium orally as there is some evidence that barium is better at identifying small leaks. No leak was identified with the barium administration. IMPRESSION: 1. No leak, status post fundoplication revision. 2. The distal esophagus has a dilated appearance except for the distal most 1.7  cm, with the dilated segments slightly overhanging the distal-most nondilated segment anteriorly, almost like a broad diverticulum. 3. Caliber in the vicinity of the wrap is about 0.4 cm. Some of this narrowing may be due to postoperative swelling. Electronically Signed   By: Van Clines M.D.   On: 06/15/2021 09:18      Discharge Medications: Allergies as of 06/15/2021       Reactions   Dilaudid [hydromorphone Hcl] Nausea And Vomiting        Medication List     TAKE these medications     escitalopram 20 MG tablet Commonly known as: LEXAPRO Take 20 mg by mouth at bedtime.   Galcanezumab-gnlm 120 MG/ML Soaj Inject 120 mg/mL into the skin every 30 (thirty) days.   omeprazole 2 mg/mL Susp oral suspension Commonly known as: FIRST-Omeprazole Take 10 mLs (20 mg total) by mouth 2 (two) times daily before a meal.   ondansetron 4 MG disintegrating tablet Commonly known as: Zofran ODT Take 1 tablet (4 mg total) by mouth every 8 (eight) hours as needed for nausea or vomiting.   SUMAtriptan 20 MG/ACT nasal spray Commonly known as: Imitrex Place 1 spray (20 mg total) into the nose every 2 (two) hours as needed for migraine or headache. May repeat in 2 hours if headache persists or recurs.   valACYclovir 500 MG tablet Commonly known as: VALTREX Take 500 mg by mouth daily as needed (fever blisters).        Follow Up Appointments:  Follow-up Information     [name], MD Follow up on 06/22/2021.   Specialty: Cardiothoracic Surgery Why: Your appointment is VIRTUAL (via telephone). Please do NOT go to the office. Dr. Chad Cordial will call you at 1:25 pm Contact information: [place] [address] [phone]                 Signed: [facility] 06/15/2021, 12:49 PM

## 2021-06-15 NOTE — Plan of Care (Signed)

## 2021-06-15 NOTE — Progress Notes (Signed)
2 PIV's removed without complication, patient tolerated well. Discharge instructions given to patient with tele-visit with MD. Patient understands all discharge instructions.

## 2021-06-15 NOTE — Progress Notes (Signed)
      [address]       [place] [address]             [phone]      1 Day Post-Op Procedure(s) (LRB): XI ROBOTIC LAPAROSCOPY REVISION OF FUNDOPLICATION (N/A) ESOPHAGOGASTRODUODENOSCOPY (EGD) (N/A) Subjective: Some minor pain  Objective: Vital signs in last 24 hours: Temp:  [97.3 F (36.3 C)-98.7 F (37.1 C)] 97.9 F (36.6 C) (09/23 0343) Pulse Rate:  [70-89] 83 (09/23 0343) Cardiac Rhythm: Normal sinus rhythm (09/23 0700) Resp:  [10-18] 13 (09/23 0343) BP: (97-114)/(61-87) 114/87 (09/23 0343) SpO2:  [92 %-97 %] 95 % (09/23 0343) Weight:  [67.6 kg] 67.6 kg (09/22 1011)  Hemodynamic parameters for last 24 hours:    Intake/Output from previous day: 09/22 0701 - 09/23 0700 In: 1428.2 [I.V.:1328.2; IV Piggyback:100] Out: 650 [Urine:650] Intake/Output this shift: No intake/output data recorded.  General appearance: alert, cooperative, and no distress Heart: regular rate and rhythm Lungs: clear to auscultation bilaterally Abdomen: minor incisional tenderness, + BS, non distended Extremities: no edema or calf tenderness Wound: healing well  Lab Results: Recent Labs    06/12/21 1503 06/15/21 0055  WBC 5.4 5.7  HGB 13.6 13.8  HCT 41.2 40.6  PLT 228 223   BMET:  Recent Labs    06/12/21 1503 06/15/21 0055  NA 136 135  K 4.0 4.5  CL 109 107  CO2 16* 21*  GLUCOSE 101* 114*  BUN 15 11  CREATININE 0.75 0.85  CALCIUM 8.7* 8.9    PT/INR:  Recent Labs    06/12/21 1503  LABPROT 12.9  INR 1.0   ABG    Component Value Date/Time   PHART 7.510 (H) 06/12/2021 1525   HCO3 16.8 (L) 06/12/2021 1525   ACIDBASEDEF 5.7 (H) 06/12/2021 1525   O2SAT 99.3 06/12/2021 1525   CBG (last 3)  No results for input(s): GLUCAP in the last 72 hours.  Meds Scheduled Meds:  ondansetron (ZOFRAN) IV  4 mg Intravenous Q6H   Continuous Infusions:  sodium chloride 50 mL/hr at 06/15/21 0601   PRN Meds:.fentaNYL (SUBLIMAZE) injection, ketorolac,  SUMAtriptan  Xrays No results found.  Assessment/Plan: S/P Procedure(s) (LRB): XI ROBOTIC LAPAROSCOPY REVISION OF FUNDOPLICATION (N/A) ESOPHAGOGASTRODUODENOSCOPY (EGD) (N/A)  1 afeb, VSS 2 sats good on RA 3 no anemia or leukocytosis 4 normal renal fxn, adeq UOP 5 esophagram this am and possibly begin feeding  LOS: 0 days    John Giovanni PA-C Pager [phone] 06/15/2021

## 2021-06-15 NOTE — Discharge Instructions (Addendum)
Discharge Instructions:  1. You may shower, please wash incisions daily with soap and water and keep dry.  If you wish to cover wounds with dressing you may do so but please keep clean and change daily.  No tub baths or swimming until incisions have completely healed.  If your incisions become red or develop any drainage please call our office at [phone]  2. No Driving until cleared by surgeon office and you are no longer using narcotic pain medications  3 Fever of 101.5 for at least 24 hours with no source, please contact our office at [phone]  4 Activity- up as tolerated, please walk at least 3 times per day.  Avoid strenuous activity   5. Please remain on a mechanical soft diet. Please avoid carbonated beverages for now.  6. If any questions or concerns arise, please do not hesitate to contact our office at [phone]

## 2021-06-22 ENCOUNTER — Other Ambulatory Visit: Payer: Self-pay

## 2021-06-22 ENCOUNTER — Ambulatory Visit (INDEPENDENT_AMBULATORY_CARE_PROVIDER_SITE_OTHER): Payer: Self-pay | Admitting: Thoracic Surgery (Cardiothoracic Vascular Surgery)

## 2021-06-22 ENCOUNTER — Telehealth: Payer: BC Managed Care – PPO | Admitting: Thoracic Surgery (Cardiothoracic Vascular Surgery)

## 2021-06-22 DIAGNOSIS — Z9889 Other specified postprocedural states: Secondary | ICD-10-CM

## 2021-06-22 DIAGNOSIS — Z8719 Personal history of other diseases of the digestive system: Secondary | ICD-10-CM

## 2021-06-22 NOTE — Progress Notes (Signed)
     [address]       [place] [address]             [phone]       Patient: Home Provider: Office Consent for Telemedicine visit obtained.  Today's visit was completed via a real-time telehealth (see specific modality noted below). The patient/authorized person provided oral consent at the time of the visit to engage in a telemedicine encounter with the present provider at [facility]. The patient/authorized person was informed of the potential benefits, limitations, and risks of telemedicine. The patient/authorized person expressed understanding that the laws that protect confidentiality also apply to telemedicine. The patient/authorized person acknowledged understanding that telemedicine does not provide emergency services and that he or she would need to call 911 or proceed to the nearest hospital for help if such a need arose.   Total time spent in the clinical discussion 10 minutes.  Telehealth Modality: Phone visit (audio only)  I had a telephone visit with Ms. Radice.  She is status post esophageal duplication cyst removal, hiatal hernia repair, and revision of fundoplication for dysphagia.  Since the revision of the fundoplication the patient states that she is able to swallow much better, and she is able to tolerate solids.  She still has some mild dysphagia but this is markedly improved.  During her redo surgery the GE junction was widely patent, and this is very improved after takedown of the fundoplication.  I think the concern is that during the resection of the duplication cyst it was deeply invested in the muscle, thus she has some motility issues above the GE junction.  Given that her swallowing has improved I will see her back in 66months to check her symptoms.

## 2021-07-06 ENCOUNTER — Telehealth: Payer: BC Managed Care – PPO | Admitting: Thoracic Surgery (Cardiothoracic Vascular Surgery)

## 2021-08-01 ENCOUNTER — Other Ambulatory Visit: Payer: Self-pay | Admitting: Obstetrics and Gynecology

## 2021-08-01 DIAGNOSIS — R928 Other abnormal and inconclusive findings on diagnostic imaging of breast: Secondary | ICD-10-CM

## 2021-08-09 ENCOUNTER — Other Ambulatory Visit: Payer: Self-pay | Admitting: Obstetrics and Gynecology

## 2021-08-09 ENCOUNTER — Other Ambulatory Visit: Payer: Self-pay

## 2021-08-09 ENCOUNTER — Ambulatory Visit
Admission: RE | Admit: 2021-08-09 | Discharge: 2021-08-09 | Disposition: A | Discharge status: Home / Self Care | Payer: BC Managed Care – PPO | Source: Ambulatory Visit | Attending: Obstetrics and Gynecology | Admitting: Obstetrics and Gynecology

## 2021-08-09 DIAGNOSIS — R928 Other abnormal and inconclusive findings on diagnostic imaging of breast: Secondary | ICD-10-CM

## 2021-08-31 ENCOUNTER — Other Ambulatory Visit: Payer: Self-pay

## 2021-08-31 ENCOUNTER — Ambulatory Visit (INDEPENDENT_AMBULATORY_CARE_PROVIDER_SITE_OTHER): Payer: Self-pay | Admitting: Thoracic Surgery (Cardiothoracic Vascular Surgery)

## 2021-08-31 DIAGNOSIS — R1319 Other dysphagia: Secondary | ICD-10-CM

## 2021-08-31 NOTE — Progress Notes (Signed)
     [address]       [place] [address]             [phone]       Patient: Home Provider: Office Consent for Telemedicine visit obtained.  Today's visit was completed via a real-time telehealth (see specific modality noted below). The patient/authorized person provided oral consent at the time of the visit to engage in a telemedicine encounter with the present provider at [facility]. The patient/authorized person was informed of the potential benefits, limitations, and risks of telemedicine. The patient/authorized person expressed understanding that the laws that protect confidentiality also apply to telemedicine. The patient/authorized person acknowledged understanding that telemedicine does not provide emergency services and that he or she would need to call 911 or proceed to the nearest hospital for help if such a need arose.   Total time spent in the clinical discussion 10 minutes.  Telehealth Modality: Phone visit (audio only)  I had a telephone visit with Kathleen Bird.  She is status post resection of the lower esophageal duplication cyst, with hiatal hernia repair.  She required a revision of her fundoplication postoperatively.  This revision was done in September 2022.  Overall doing well.  Swallowing is better.  Has some difficulty with grainier foods and rice.  She has some reflux, but this controlled with omeprazole.  Her weight has been stable.  She has not established care with gastroenterology.  I will give her a referral back to Dr.Nandigam for ongoing follow-up of her dysphagia.  She will follow-up with me as needed.

## 2021-09-04 ENCOUNTER — Telehealth: Payer: Self-pay

## 2021-09-04 NOTE — Telephone Encounter (Signed)
Patient contacted and scheduled for 10/16/21 at 8:10 am.

## 2021-09-04 NOTE — Telephone Encounter (Signed)
-----   Message from Mauri Pole, MD sent at 09/03/2021  1:11 PM EST ----- We will bring her in follow up visit, thank you for letting me know.  Beth, please schedule next available appt with me or APP  Thanks VN ----- Message ----- From: [name], MD Sent: 08/31/2021   3:14 PM EST To: Mauri Pole, MD  Hey, Nothing urgent, but I wanted to make sure that Mrs. Sinn had GI follow-up.  She had the duplication cyst removed along with a hiatal hernia repair.  She had a revision of her fundoplication due to dysphagia, but this is likely secondary to the myotomy that had to be performed to remove the duplication cyst.  She continues to have some dysphagia to grainy her foods and rice, but this is intermittent.  Please let me know your thoughts, H

## 2021-09-21 ENCOUNTER — Telehealth: Payer: BC Managed Care – PPO | Admitting: Thoracic Surgery (Cardiothoracic Vascular Surgery)

## 2021-10-16 ENCOUNTER — Ambulatory Visit: Payer: BC Managed Care – PPO | Admitting: Gastroenterology

## 2021-10-16 ENCOUNTER — Encounter: Payer: Self-pay | Admitting: Gastroenterology

## 2021-10-16 VITALS — BP 90/64 | HR 84 | Ht 62.5 in | Wt 155.2 lb

## 2021-10-16 DIAGNOSIS — R1310 Dysphagia, unspecified: Secondary | ICD-10-CM

## 2021-10-16 DIAGNOSIS — K219 Gastro-esophageal reflux disease without esophagitis: Secondary | ICD-10-CM

## 2021-10-16 DIAGNOSIS — Z9889 Other specified postprocedural states: Secondary | ICD-10-CM

## 2021-10-16 DIAGNOSIS — Q398 Other congenital malformations of esophagus: Secondary | ICD-10-CM

## 2021-10-16 NOTE — Progress Notes (Signed)
Kathleen Bird    [id number]    [birth year]  Primary Care Physician:White, Caren Griffins, MD  Referring Physician: Harlan Stains, MD [place],  [place] [id number]   Chief complaint: Dysphagia  HPI:  46 year old very pleasant female here for follow-up visit with complaints of dysphagia s/p lower esophageal duplication cyst removal and hiatal hernia repair.  She had revision of fundoplication, had some improvement of dysphagia but she feels certain foods are slow going down when she swallows specially dense breads, meat and also sometimes rice.  She is currently taking omeprazole 40 mg daily with good control of acid reflux symptoms  Prior to revision of fundoplication, she had to regurgitate foods because she felt they were getting hung up in her throat.  Overall she feels her symptoms are slowly improving. Her weight has been stable Denies any abdominal pain, vomiting, change in bowel habits, melena or rectal bleeding.  EGD 04/16/21 - LA Grade C reflux esophagitis with no bleeding. Biopsied. - Dilation in the middle third of the esophagus. - Gastroesophageal flap valve classified as Hill Grade III (minimal fold, loose to endoscope, hiatal hernia likely). - 3 cm hiatal hernia. - Non-bleeding gastric ulcers with no stigmata of bleeding. Biopsied. - Normal examined duodenum. 1. Surgical [P], gastric antrum and gastric body - REACTIVE GASTROPATHY - NO H. PYLORI OR INTESTINAL METAPLASIA IDENTIFIED - SEE COMMENT 2. Surgical [P], esophageal bx - SQUAMOCOLUMNAR JUNCTION WITH CHRONIC INFLAMMATION - NO INTESTINAL METAPLASIA, DYSPLASIA OR MALIGNANCY IDENTIFIED  Colonoscopy April 2022 by Dr. Therisa Doyne showed normal terminal ileum, mild erythematous mucosa in descending colon that was biopsied which showed no abnormality otherwise normal exam  Outpatient Encounter Medications as of 10/16/2021  Medication Sig   escitalopram (LEXAPRO) 20 MG tablet Take 20  mg by mouth at bedtime.   Galcanezumab-gnlm 120 MG/ML SOAJ Inject 120 mg/mL into the skin every 30 (thirty) days.   omeprazole (PRILOSEC) 40 MG capsule Take 40 mg by mouth daily.   ondansetron (ZOFRAN ODT) 4 MG disintegrating tablet Take 1 tablet (4 mg total) by mouth every 8 (eight) hours as needed for nausea or vomiting.   SUMAtriptan (IMITREX) 100 MG tablet Take 1 tablet by mouth as needed.   valACYclovir (VALTREX) 1000 MG tablet Take 1,000 mg by mouth 2 (two) times daily as needed.   [DISCONTINUED] omeprazole (FIRST-OMEPRAZOLE) 2 mg/mL SUSP oral suspension Take 10 mLs (20 mg total) by mouth 2 (two) times daily before a meal.   [DISCONTINUED] SUMAtriptan (IMITREX) 20 MG/ACT nasal spray Place 1 spray (20 mg total) into the nose every 2 (two) hours as needed for migraine or headache. May repeat in 2 hours if headache persists or recurs.   [DISCONTINUED] valACYclovir (VALTREX) 500 MG tablet Take 500 mg by mouth daily as needed (fever blisters).   No facility-administered encounter medications on file as of 10/16/2021.    Allergies as of 10/16/2021 - Review Complete 10/16/2021  Allergen Reaction Noted   Dilaudid [hydromorphone hcl] Nausea And Vomiting 04/10/2021    Past Medical History:  Diagnosis Date   Anxiety    on meds   Arthritis    bilateral hands   Complication of anesthesia    Depression    on meds   GERD (gastroesophageal reflux disease)    on meds   History of hiatal hernia    History of kidney stones    Migraines    on meds   PONV (postoperative nausea  and vomiting)     Past Surgical History:  Procedure Laterality Date   COLONOSCOPY  12/2020   [facility]   DILATION AND CURETTAGE OF UTERUS     ESOPHAGOGASTRODUODENOSCOPY N/A 05/10/2021   Procedure: ESOPHAGOGASTRODUODENOSCOPY (EGD);  Surgeon: [name], MD;  Location: [facility] OR;  Service: Thoracic;  Laterality: N/A;   ESOPHAGOGASTRODUODENOSCOPY N/A 06/14/2021   Procedure: ESOPHAGOGASTRODUODENOSCOPY  (EGD);  Surgeon: [name], MD;  Location: [place];  Service: Thoracic;  Laterality: N/A;   EXTRACORPOREAL SHOCK WAVE LITHOTRIPSY Right 02/26/2021   Procedure: RIGHT EXTRACORPOREAL SHOCK WAVE LITHOTRIPSY (ESWL);  Surgeon: Franchot Gallo, MD;  Location: [facility];  Service: Urology;  Laterality: Right;   HAND SURGERY Left 2016   joint fusion in hand   tendon replacement Right 07/2020   UPPER GASTROINTESTINAL ENDOSCOPY  12/2020   WISDOM TOOTH EXTRACTION     XI ROBOTIC ASSISTED HIATAL HERNIA REPAIR N/A 05/10/2021   Procedure: XI ROBOTIC ASSISTED LAPAROSCOPY HIATAL HERNIA REPAIR AND FUNDOPLICATION;  Surgeon: [name], MD;  Location: [facility] OR;  Service: Thoracic;  Laterality: N/A;    Family History  Problem Relation Age of Onset   Healthy Mother    Glaucoma Father    Colon polyps Neg Hx    Colon cancer Neg Hx    Esophageal cancer Neg Hx    Stomach cancer Neg Hx    Rectal cancer Neg Hx     Social History   Socioeconomic History   Marital status: Married    Spouse name: Not on file   Number of children: Not on file   Years of education: Not on file   Highest education level: Not on file  Occupational History   Not on file  Tobacco Use   Smoking status: Never   Smokeless tobacco: Never  Vaping Use   Vaping Use: Never used  Substance and Sexual Activity   Alcohol use: Not Currently   Drug use: Never   Sexual activity: Yes  Other Topics Concern   Not on file  Social History Narrative   Not on file   Social Determinants of Health   Financial Resource Strain: Not on file  Food Insecurity: Not on file  Transportation Needs: Not on file  Physical Activity: Not on file  Stress: Not on file  Social Connections: Not on file  Intimate Partner Violence: Not on file      Review of systems: All other review of systems negative except as mentioned in the HPI.   Physical Exam: Vitals:   10/16/21 0815  BP: 90/64  Pulse: 84   Body  mass index is 27.94 kg/m. Gen:      No acute distress HEENT:  sclera anicteric Abd:      soft, non-tender; no palpable masses, no distension Ext:    No edema Neuro: alert and oriented x 3 Psych: normal mood and affect  Data Reviewed:  Reviewed labs, radiology imaging, old records and pertinent past GI work up   Assessment and Plan/Recommendations:  46 year old very pleasant female s/p resection of symptomatic lower esophageal duplication cyst and hiatal hernia repair, s/p revision of fundoplication here with complaints of persistent mild solid dysphagia We will plan for EGD for evaluation and esophageal dilation if needed Advised patient to avoid dense bread, tough meat and raw vegetables until EGD to prevent any episodes of esophageal food impaction If continues to have persistent dysphagia s/p esophageal dilation, will plan to proceed with esophageal manometry to exclude any major motility  disorder  The risks and benefits as well as alternatives of endoscopic procedure(s) have been discussed and reviewed. All questions answered. The patient agrees to proceed.   Continue omeprazole 40 mg daily and antireflux measures for GERD  Return in 2 to 3 months  The patient was provided an opportunity to ask questions and all were answered. The patient agreed with the plan and demonstrated an understanding of the instructions.  Damaris Hippo , MD    CC: Harlan Stains, MD

## 2021-10-16 NOTE — Patient Instructions (Signed)
You have been scheduled for an esophageal manometry at [facility] on 01/02/2022 at 8:30am. Please arrive 30 minutes prior to your procedure for registration. You will need to go to outpatient registration (1st floor of the hospital) first. Make certain to bring your insurance cards as well as a complete list of medications.  Please remember the following:  You can stay on PPI Medication   1) Do not take any muscle relaxants, xanax (alprazolam) or ativan for 1 day prior to your test as well as the day of the test.  2) Nothing to eat or drink for 4 hours before your test.  3) Hold all diabetic medications/insulin the morning of the test. You may eat and take your medications after the test.  It will take at least 2 weeks to receive the results of this test from your physician. ------------------------------------------ ABOUT ESOPHAGEAL MANOMETRY Esophageal manometry (muh-NOM-uh-tree) is a test that gauges how well your esophagus works. Your esophagus is the long, muscular tube that connects your throat to your stomach. Esophageal manometry measures the rhythmic muscle contractions (peristalsis) that occur in your esophagus when you swallow. Esophageal manometry also measures the coordination and force exerted by the muscles of your esophagus.  During esophageal manometry, a thin, flexible tube (catheter) that contains sensors is passed through your nose, down your esophagus and into your stomach. Esophageal manometry can be helpful in diagnosing some mostly uncommon disorders that affect your esophagus.  Why it's done Esophageal manometry is used to evaluate the movement (motility) of food through the esophagus and into the stomach. The test measures how well the circular bands of muscle (sphincters) at the top and bottom of your esophagus open and close, as well as the pressure, strength and pattern of the wave of esophageal muscle contractions that moves food along.  What you can  expect Esophageal manometry is an outpatient procedure done without sedation. Most people tolerate it well. You may be asked to change into a hospital gown before the test starts.  During esophageal manometry  While you are sitting up, a member of your health care team sprays your throat with a numbing medication or puts numbing gel in your nose or both.  A catheter is guided through your nose into your esophagus. The catheter may be sheathed in a water-filled sleeve. It doesn't interfere with your breathing. However, your eyes may water, and you may gag. You may have a slight nosebleed from irritation.  After the catheter is in place, you may be asked to lie on your back on an exam table, or you may be asked to remain seated.  You then swallow small sips of water. As you do, a computer connected to the catheter records the pressure, strength and pattern of your esophageal muscle contractions.  During the test, you'll be asked to breathe slowly and smoothly, remain as still as possible, and swallow only when you're asked to do so.  A member of your health care team may move the catheter down into your stomach while the catheter continues its measurements.  The catheter then is slowly withdrawn. The test usually lasts 20 to 30 minutes.  After esophageal manometry  When your esophageal manometry is complete, you may return to your normal activities  This test typically takes 30-45 minutes to complete. ________________________________________________________________________________   Due to recent changes in healthcare laws, you may see the results of your imaging and laboratory studies on MyChart before your provider has had a chance to review them.  We understand that in some cases there may be results that are confusing or concerning to you. Not all laboratory results come back in the same time frame and the provider may be waiting for multiple results in order to interpret others.  Please give [place]  48 hours in order for your provider to thoroughly review all the results before contacting the office for clarification of your results.    If you are age 18 or older, your body mass index should be between 23-30. Your Body mass index is 27.94 kg/m. If this is out of the aforementioned range listed, please consider follow up with your Primary Care Provider.  If you are age 47 or younger, your body mass index should be between 19-25. Your Body mass index is 27.94 kg/m. If this is out of the aformentioned range listed, please consider follow up with your Primary Care Provider.   ________________________________________________________  The [facility] providers would like to encourage you to use [facility] to communicate with providers for non-urgent requests or questions.  Due to long hold times on the telephone, sending your provider a message by [facility] may be a faster and more efficient way to get a response.  Please allow 48 business hours for a response.  Please remember that this is for non-urgent requests.  _______________________________________________________   Dennis Bast have been scheduled for an endoscopy. Please follow written instructions given to you at your visit today. If you use inhalers (even only as needed), please bring them with you on the day of your procedure.   Gastroesophageal Reflux Disease, Adult Gastroesophageal reflux (GER) happens when acid from the stomach flows up into the tube that connects the mouth and the stomach (esophagus). Normally, food travels down the esophagus and stays in the stomach to be digested. However, when a person has GER, food and stomach acid sometimes move back up into the esophagus. If this becomes a more serious problem, the person may be diagnosed with a disease called gastroesophageal reflux disease (GERD). GERD occurs when the reflux: Happens often. Causes frequent or severe symptoms. Causes problems such as damage to the esophagus. When stomach  acid comes in contact with the esophagus, the acid may cause inflammation in the esophagus. Over time, GERD may create small holes (ulcers) in the lining of the esophagus. What are the causes? This condition is caused by a problem with the muscle between the esophagus and the stomach (lower esophageal sphincter, or LES). Normally, the LES muscle closes after food passes through the esophagus to the stomach. When the LES is weakened or abnormal, it does not close properly, and that allows food and stomach acid to go back up into the esophagus. The LES can be weakened by certain dietary substances, medicines, and medical conditions, including: Tobacco use. Pregnancy. Having a hiatal hernia. Alcohol use. Certain foods and beverages, such as coffee, chocolate, onions, and peppermint. What increases the risk? You are more likely to develop this condition if you: Have an increased body weight. Have a connective tissue disorder. Take NSAIDs, such as ibuprofen. What are the signs or symptoms? Symptoms of this condition include: Heartburn. Difficult or painful swallowing and the feeling of having a lump in the throat. A bitter taste in the mouth. Bad breath and having a large amount of saliva. Having an upset or bloated stomach and belching. Chest pain. Different conditions can cause chest pain. Make sure you see your health care provider if you experience chest pain. Shortness of breath or wheezing. Ongoing (  chronic) cough or a nighttime cough. Wearing away of tooth enamel. Weight loss. How is this diagnosed? This condition may be diagnosed based on a medical history and a physical exam. To determine if you have mild or severe GERD, your health care provider may also monitor how you respond to treatment. You may also have tests, including: A test to examine your stomach and esophagus with a small camera (endoscopy). A test that measures the acidity level in your esophagus. A test that measures  how much pressure is on your esophagus. A barium swallow or modified barium swallow test to show the shape, size, and functioning of your esophagus. How is this treated? Treatment for this condition may vary depending on how severe your symptoms are. Your health care provider may recommend: Changes to your diet. Medicine. Surgery. The goal of treatment is to help relieve your symptoms and to prevent complications. Follow these instructions at home: Eating and drinking  Follow a diet as recommended by your health care provider. This may involve avoiding foods and drinks such as: Coffee and tea, with or without caffeine. Drinks that contain alcohol. Energy drinks and sports drinks. Carbonated drinks or sodas. Chocolate and cocoa. Peppermint and mint flavorings. Garlic and onions. Horseradish. Spicy and acidic foods, including peppers, chili powder, curry powder, vinegar, hot sauces, and barbecue sauce. Citrus fruit juices and citrus fruits, such as oranges, lemons, and limes. Tomato-based foods, such as red sauce, chili, salsa, and pizza with red sauce. Fried and fatty foods, such as donuts, french fries, potato chips, and high-fat dressings. High-fat meats, such as hot dogs and fatty cuts of red and white meats, such as rib eye steak, sausage, ham, and bacon. High-fat dairy items, such as whole milk, butter, and cream cheese. Eat small, frequent meals instead of large meals. Avoid drinking large amounts of liquid with your meals. Avoid eating meals during the 2-3 hours before bedtime. Avoid lying down right after you eat. Do not exercise right after you eat. Lifestyle  Do not use any products that contain nicotine or tobacco. These products include cigarettes, chewing tobacco, and vaping devices, such as e-cigarettes. If you need help quitting, ask your health care provider. Try to reduce your stress by using methods such as yoga or meditation. If you need help reducing stress, ask  your health care provider. If you are overweight, reduce your weight to an amount that is healthy for you. Ask your health care provider for guidance about a safe weight loss goal. General instructions Pay attention to any changes in your symptoms. Take over-the-counter and prescription medicines only as told by your health care provider. Do not take aspirin, ibuprofen, or other NSAIDs unless your health care provider told you to take these medicines. Wear loose-fitting clothing. Do not wear anything tight around your waist that causes pressure on your abdomen. Raise (elevate) the head of your bed about 6 inches (15 cm). You can use a wedge to do this. Avoid bending over if this makes your symptoms worse. Keep all follow-up visits. This is important. Contact a health care provider if: You have: New symptoms. Unexplained weight loss. Difficulty swallowing or it hurts to swallow. Wheezing or a persistent cough. A hoarse voice. Your symptoms do not improve with treatment. Get help right away if: You have sudden pain in your arms, neck, jaw, teeth, or back. You suddenly feel sweaty, dizzy, or light-headed. You have chest pain or shortness of breath. You vomit and the vomit is green, yellow, or  black, or it looks like blood or coffee grounds. You faint. You have stool that is red, bloody, or black. You cannot swallow, drink, or eat. These symptoms may represent a serious problem that is an emergency. Do not wait to see if the symptoms will go away. Get medical help right away. Call your local emergency services (911 in the [place] Do not drive yourself to the hospital. Summary Gastroesophageal reflux happens when acid from the stomach flows up into the esophagus. GERD is a disease in which the reflux happens often, causes frequent or severe symptoms, or causes problems such as damage to the esophagus. Treatment for this condition may vary depending on how severe your symptoms are. Your health  care provider may recommend diet and lifestyle changes, medicine, or surgery. Contact a health care provider if you have new or worsening symptoms. Take over-the-counter and prescription medicines only as told by your health care provider. Do not take aspirin, ibuprofen, or other NSAIDs unless your health care provider told you to do so. Keep all follow-up visits as told by your health care provider. This is important. This information is not intended to replace advice given to you by your health care provider. Make sure you discuss any questions you have with your health care provider. Document Revised: 03/20/2020 Document Reviewed: 03/20/2020 Elsevier Patient Education  [place].   I appreciate the  opportunity to care for you  Thank You   Harl Bowie , MD

## 2021-10-19 ENCOUNTER — Encounter: Payer: Self-pay | Admitting: Gastroenterology

## 2021-10-26 ENCOUNTER — Other Ambulatory Visit: Payer: Self-pay

## 2021-10-26 ENCOUNTER — Ambulatory Visit (AMBULATORY_SURGERY_CENTER): Payer: BC Managed Care – PPO | Admitting: Gastroenterology

## 2021-10-26 ENCOUNTER — Encounter: Payer: Self-pay | Admitting: Gastroenterology

## 2021-10-26 VITALS — BP 111/75 | HR 75 | Temp 97.5°F | Resp 18 | Ht 62.0 in | Wt 155.0 lb

## 2021-10-26 DIAGNOSIS — K219 Gastro-esophageal reflux disease without esophagitis: Secondary | ICD-10-CM | POA: Diagnosis not present

## 2021-10-26 DIAGNOSIS — T182XXA Foreign body in stomach, initial encounter: Secondary | ICD-10-CM | POA: Diagnosis not present

## 2021-10-26 DIAGNOSIS — K2289 Other specified disease of esophagus: Secondary | ICD-10-CM

## 2021-10-26 DIAGNOSIS — R1310 Dysphagia, unspecified: Secondary | ICD-10-CM

## 2021-10-26 DIAGNOSIS — K259 Gastric ulcer, unspecified as acute or chronic, without hemorrhage or perforation: Secondary | ICD-10-CM

## 2021-10-26 MED ORDER — OMEPRAZOLE 40 MG PO CPDR: 40.0000 mg | DELAYED_RELEASE_CAPSULE | Freq: Two times a day (BID) | ORAL | 3 refills | Status: DC

## 2021-10-26 MED ORDER — SODIUM CHLORIDE 0.9 % IV SOLN: 500.0000 mL | INTRAVENOUS | Status: DC

## 2021-10-26 NOTE — Progress Notes (Signed)
Please refer to office visit note 10/16/20. No additional changes in H&P Patient is appropriate for planned procedure(s) and anesthesia in an ambulatory setting  K. Denzil Magnuson , MD [phone]

## 2021-10-26 NOTE — Patient Instructions (Addendum)
Start Prilosec (omeprazole) 40 mg twice a day for 3 months   Low residue diet. Small meals with low fat  Follow up esophageal manometry ( already scheduled) will plan for repeat EGD after esophageal manometry esophageal.   YOU HAD AN ENDOSCOPIC PROCEDURE TODAY AT [facility]:   Refer to the procedure report that was given to you for any specific questions about what was found during the examination.  If the procedure report does not answer your questions, please call your gastroenterologist to clarify.  If you requested that your care partner not be given the details of your procedure findings, then the procedure report has been included in a sealed envelope for you to review at your convenience later.  YOU SHOULD EXPECT: Some feelings of bloating in the abdomen. Passage of more gas than usual.  Walking can help get rid of the air that was put into your GI tract during the procedure and reduce the bloating. If you had a lower endoscopy (such as a colonoscopy or flexible sigmoidoscopy) you may notice spotting of blood in your stool or on the toilet paper. If you underwent a bowel prep for your procedure, you may not have a normal bowel movement for a few days.  Please Note:  You might notice some irritation and congestion in your nose or some drainage.  This is from the oxygen used during your procedure.  There is no need for concern and it should clear up in a day or so.  SYMPTOMS TO REPORT IMMEDIATELY: Following upper endoscopy (EGD)  Vomiting of blood or coffee ground material  New chest pain or pain under the shoulder blades  Painful or persistently difficult swallowing  New shortness of breath  Fever of 100F or higher  Black, tarry-looking stools  For urgent or emergent issues, a gastroenterologist can be reached at any hour by calling [phone]. Do not use MyChart messaging for urgent concerns.    DIET:  We do recommend a small meal at first, but then you may  proceed to your regular diet.  Drink plenty of fluids but you should avoid alcoholic beverages for 24 hours.  ACTIVITY:  You should plan to take it easy for the rest of today and you should NOT DRIVE or use heavy machinery until tomorrow (because of the sedation medicines used during the test).    FOLLOW UP: Our staff will call the number listed on your records 48-72 hours following your procedure to check on you and address any questions or concerns that you may have regarding the information given to you following your procedure. If we do not reach you, we will leave a message.  We will attempt to reach you two times.  During this call, we will ask if you have developed any symptoms of COVID 19. If you develop any symptoms (ie: fever, flu-like symptoms, shortness of breath, cough etc.) before then, please call [phone].  If you test positive for Covid 19 in the 2 weeks post procedure, please call and report this information to [place].    If any biopsies were taken you will be contacted by phone or by letter within the next 1-3 weeks.  Please call us at [phone] if you have not heard about the biopsies in 3 weeks.    SIGNATURES/CONFIDENTIALITY: You and/or your care partner have signed paperwork which will be entered into your electronic medical record.  These signatures attest to the fact that that the information above on your After  Visit Summary has been reviewed and is understood.  Full responsibility of the confidentiality of this discharge information lies with you and/or your care-partner.

## 2021-10-26 NOTE — Progress Notes (Signed)
Pt's states no medical or surgical changes since previsit or office visit. 

## 2021-10-26 NOTE — Progress Notes (Signed)
To pacu, VSS. Report to [email or web address] 

## 2021-10-26 NOTE — Op Note (Addendum)
[facility] Patient Name: Kathleen Bird Procedure Date: 10/26/2021 8:38 AM MRN: [id number] Endoscopist: Mauri Pole , MD Age: 46 Referring MD:  Date of Birth: 10-30-1975 Gender: Female Account #: [id number] Procedure:                Upper GI endoscopy Indications:              Dysphagia Medicines:                Monitored Anesthesia Care Procedure:                Pre-Anesthesia Assessment:                           - Prior to the procedure, a History and Physical                            was performed, and patient medications and                            allergies were reviewed. The patient's tolerance of                            previous anesthesia was also reviewed. The risks                            and benefits of the procedure and the sedation                            options and risks were discussed with the patient.                            All questions were answered, and informed consent                            was obtained. Prior Anticoagulants: The patient has                            taken no previous anticoagulant or antiplatelet                            agents. ASA Grade Assessment: II - A patient with                            mild systemic disease. After reviewing the risks                            and benefits, the patient was deemed in                            satisfactory condition to undergo the procedure.                           After obtaining informed consent, the endoscope was  passed under direct vision. Throughout the                            procedure, the patient's blood pressure, pulse, and                            oxygen saturations were monitored continuously. The                            GIF [id number] was introduced through the                            mouth, with the intention of advancing to the                            stomach. The scope was advanced to the  gastric                            cardia before the procedure was aborted.                            Medications were given. The upper GI endoscopy was                            somewhat difficult due to presence of food. The                            patient tolerated the procedure well. Scope In: Scope Out: Findings:                 Food was found in the lower third of the esophagus.                            Removal of food was accomplished.                           The distal esophagus was moderately tortuous.                           LA Grade B (one or more mucosal breaks greater than                            5 mm, not extending between the tops of two mucosal                            folds) esophagitis with no bleeding was found 34 to                            36 cm from the incisors.                           A large amount of food (residue) was found in the  gastric fundus and in the gastric body. Procedure                            was aborted and did not attempt esopahegal dilation                            or further intervention due to increased risk for                            aspiration Complications:            No immediate complications. Estimated Blood Loss:     Estimated blood loss was minimal. Impression:               - Food in the lower third of the esophagus. Removal                            was successful.                           - Tortuous esophagus.                           - LA Grade B reflux esophagitis with no bleeding.                           - A large amount of food (residue) in the stomach. Recommendation:           - Patient has a contact number available for                            emergencies. The signs and symptoms of potential                            delayed complications were discussed with the                            patient. Return to normal activities tomorrow.                             Written discharge instructions were provided to the                            patient.                           - Low residue diet. Small meals with low fat                           - Continue present medications.                           - Use Prilosec (omeprazole) 40 mg PO BID for 3                            months.                           -  Follow up esophageal manometry (already                            scheduled), will plan for repeat EGD after                            esophageal manometry Mauri Pole, MD 10/26/2021 8:59:38 AM This report has been signed electronically.

## 2021-10-30 ENCOUNTER — Telehealth: Payer: Self-pay | Admitting: *Deleted

## 2021-10-30 NOTE — Telephone Encounter (Signed)
°  Follow up Call-  Call back number 10/26/2021 04/16/2021  Post procedure Call Back phone  # [phone]  Permission to leave phone message Yes Yes  Some recent data might be hidden     Patient questions:  Do you have a fever, pain , or abdominal swelling? No. Pain Score  0 *  Have you tolerated food without any problems? Yes.    Have you been able to return to your normal activities? Yes.    Do you have any questions about your discharge instructions: Diet   No. Medications  No. Follow up visit  No.  Do you have questions or concerns about your Care? No.  Actions: * If pain score is 4 or above: No action needed, pain <4.

## 2021-12-11 ENCOUNTER — Other Ambulatory Visit: Payer: Self-pay | Admitting: Endocrinology

## 2021-12-11 DIAGNOSIS — E049 Nontoxic goiter, unspecified: Secondary | ICD-10-CM

## 2021-12-13 ENCOUNTER — Ambulatory Visit
Admission: RE | Admit: 2021-12-13 | Discharge: 2021-12-13 | Disposition: A | Discharge status: Home / Self Care | Payer: BC Managed Care – PPO | Source: Ambulatory Visit | Attending: Endocrinology | Admitting: Endocrinology

## 2021-12-13 ENCOUNTER — Other Ambulatory Visit: Payer: Self-pay

## 2021-12-13 DIAGNOSIS — E049 Nontoxic goiter, unspecified: Secondary | ICD-10-CM

## 2021-12-25 ENCOUNTER — Encounter (HOSPITAL_COMMUNITY): Payer: Self-pay | Admitting: Gastroenterology

## 2022-01-02 ENCOUNTER — Ambulatory Visit (HOSPITAL_COMMUNITY)
Admission: RE | Admit: 2022-01-02 | Discharge: 2022-01-02 | Disposition: A | Discharge status: Home / Self Care | Payer: BC Managed Care – PPO | Attending: Gastroenterology | Admitting: Gastroenterology

## 2022-01-02 ENCOUNTER — Encounter (HOSPITAL_COMMUNITY)
Admission: RE | Disposition: A | Discharge status: Home / Self Care | Payer: Self-pay | Source: Home / Self Care | Attending: Gastroenterology

## 2022-01-02 DIAGNOSIS — R1310 Dysphagia, unspecified: Secondary | ICD-10-CM

## 2022-01-02 DIAGNOSIS — R1319 Other dysphagia: Secondary | ICD-10-CM

## 2022-01-02 HISTORY — PX: ESOPHAGEAL MANOMETRY: SHX5429

## 2022-01-02 SURGERY — MANOMETRY, ESOPHAGUS

## 2022-01-02 MED ORDER — LIDOCAINE VISCOUS HCL 2 % MT SOLN
OROMUCOSAL | Status: AC
  Filled 2022-01-02: qty 15

## 2022-01-02 SURGICAL SUPPLY — 2 items
FACESHIELD LNG OPTICON STERILE (SAFETY) IMPLANT
GLOVE BIO SURGEON STRL SZ8 (GLOVE) ×6 IMPLANT

## 2022-01-02 NOTE — Progress Notes (Signed)
Esophageal Manometry done per protocol. Patient tolerated well without distress or complication.  

## 2022-01-03 ENCOUNTER — Encounter (HOSPITAL_COMMUNITY): Payer: Self-pay | Admitting: Gastroenterology

## 2022-01-09 ENCOUNTER — Encounter: Payer: Self-pay | Admitting: Gastroenterology

## 2022-01-18 ENCOUNTER — Encounter: Payer: Self-pay | Admitting: Gastroenterology

## 2022-01-18 DIAGNOSIS — R1310 Dysphagia, unspecified: Secondary | ICD-10-CM

## 2022-01-18 DIAGNOSIS — R1319 Other dysphagia: Secondary | ICD-10-CM

## 2022-01-18 NOTE — Progress Notes (Signed)
Ok to schedule in May  one of the hemorrhoidal banding spot.  Thanks ?

## 2022-01-29 ENCOUNTER — Other Ambulatory Visit: Payer: Self-pay

## 2022-01-29 ENCOUNTER — Telehealth: Payer: Self-pay | Admitting: Gastroenterology

## 2022-01-29 DIAGNOSIS — K311 Adult hypertrophic pyloric stenosis: Secondary | ICD-10-CM

## 2022-01-29 DIAGNOSIS — R1310 Dysphagia, unspecified: Secondary | ICD-10-CM

## 2022-01-29 NOTE — Telephone Encounter (Signed)
Spoke with patient. Reviewed her instructions for the EGD at [facility] on 02/11/22. Appointment in office for follow up scheduled for 03/12/22 at 3:30 pm. Questions invited and answered. Written instructions mailed and accessible to patient through My Chart. ?

## 2022-01-29 NOTE — Telephone Encounter (Signed)
Discussed findings of esophageal manometry with patient, consistent with EGJ outflow obstruction. ?We will plan to do esophageal dilation of EG junction with 20 mm balloon, will need to schedule the procedure at hospital endoscopy unit with general anesthesia given she had significant retained fluid and food in the esophagus on prior EGD in February 2023. ? ?Please schedule the procedure at [facility] May 22.Marland Kitchen Reschedule office visit to June after the procedure. Thanks ?

## 2022-02-01 ENCOUNTER — Ambulatory Visit: Payer: BC Managed Care – PPO | Admitting: Gastroenterology

## 2022-02-04 ENCOUNTER — Encounter (HOSPITAL_COMMUNITY): Payer: Self-pay | Admitting: Gastroenterology

## 2022-02-07 ENCOUNTER — Ambulatory Visit
Admission: RE | Admit: 2022-02-07 | Discharge: 2022-02-07 | Disposition: A | Discharge status: Home / Self Care | Payer: BC Managed Care – PPO | Source: Ambulatory Visit | Attending: Obstetrics and Gynecology | Admitting: Obstetrics and Gynecology

## 2022-02-07 ENCOUNTER — Other Ambulatory Visit: Payer: Self-pay | Admitting: Obstetrics and Gynecology

## 2022-02-07 DIAGNOSIS — R928 Other abnormal and inconclusive findings on diagnostic imaging of breast: Secondary | ICD-10-CM

## 2022-02-07 DIAGNOSIS — N6310 Unspecified lump in the right breast, unspecified quadrant: Secondary | ICD-10-CM

## 2022-02-10 NOTE — Anesthesia Preprocedure Evaluation (Addendum)
Anesthesia Evaluation  Patient identified by MRN, date of birth, ID band Patient awake    Reviewed: Allergy & Precautions, NPO status , Patient's Chart, lab work & pertinent test results  History of Anesthesia Complications (+) PONV and history of anesthetic complications  Airway Mallampati: I  TM Distance: >3 FB Neck ROM: Full    Dental no notable dental hx.    Pulmonary neg pulmonary ROS,    Pulmonary exam normal breath sounds clear to auscultation       Cardiovascular Exercise Tolerance: Good negative cardio ROS Normal cardiovascular exam Rhythm:Regular Rate:Normal     Neuro/Psych  Headaches, PSYCHIATRIC DISORDERS Anxiety negative neurological ROS  negative psych ROS   GI/Hepatic Neg liver ROS, hiatal hernia, GERD  ,H/o esophageal mass   Endo/Other  negative endocrine ROS  Renal/GU negative Renal ROS  negative genitourinary   Musculoskeletal negative musculoskeletal ROS (+)   Abdominal   Peds negative pediatric ROS (+)  Hematology negative hematology ROS (+)   Anesthesia Other Findings   Reproductive/Obstetrics negative OB ROS                            Anesthesia Physical Anesthesia Plan  ASA: 2  Anesthesia Plan: MAC   Post-op Pain Management:    Induction: Intravenous  PONV Risk Score and Plan: 3 and Propofol infusion, TIVA and Treatment may vary due to age or medical condition  Airway Management Planned: Natural Airway and Nasal Cannula  Additional Equipment: None  Intra-op Plan:   Post-operative Plan:   Informed Consent: I have reviewed the patients History and Physical, chart, labs and discussed the procedure including the risks, benefits and alternatives for the proposed anesthesia with the patient or authorized representative who has indicated his/her understanding and acceptance.     Dental advisory given  Plan Discussed with: Anesthesiologist and  CRNA  Anesthesia Plan Comments: (MAC vs. GETA. Patient did all clears yesterday.  She does not think she has food stuck. Per patient Dr. Silverio Decamp might want her intubated for the procedure. Norton Blizzard, MD  )       Anesthesia Quick Evaluation

## 2022-02-11 ENCOUNTER — Encounter (HOSPITAL_COMMUNITY)
Admission: RE | Disposition: A | Discharge status: Home / Self Care | Payer: Self-pay | Source: Home / Self Care | Attending: Gastroenterology

## 2022-02-11 ENCOUNTER — Ambulatory Visit (HOSPITAL_COMMUNITY)
Admission: RE | Admit: 2022-02-11 | Discharge: 2022-02-11 | Disposition: A | Discharge status: Home / Self Care | Payer: BC Managed Care – PPO | Attending: Gastroenterology | Admitting: Gastroenterology

## 2022-02-11 ENCOUNTER — Ambulatory Visit (HOSPITAL_COMMUNITY): Payer: BC Managed Care – PPO | Admitting: Anesthesiology

## 2022-02-11 ENCOUNTER — Encounter (HOSPITAL_COMMUNITY): Payer: Self-pay | Admitting: Gastroenterology

## 2022-02-11 DIAGNOSIS — F419 Anxiety disorder, unspecified: Secondary | ICD-10-CM | POA: Diagnosis not present

## 2022-02-11 DIAGNOSIS — K3184 Gastroparesis: Secondary | ICD-10-CM

## 2022-02-11 DIAGNOSIS — K222 Esophageal obstruction: Secondary | ICD-10-CM | POA: Diagnosis not present

## 2022-02-11 DIAGNOSIS — K2289 Other specified disease of esophagus: Secondary | ICD-10-CM | POA: Diagnosis not present

## 2022-02-11 DIAGNOSIS — K219 Gastro-esophageal reflux disease without esophagitis: Secondary | ICD-10-CM | POA: Diagnosis not present

## 2022-02-11 DIAGNOSIS — R1310 Dysphagia, unspecified: Secondary | ICD-10-CM

## 2022-02-11 DIAGNOSIS — F32A Depression, unspecified: Secondary | ICD-10-CM | POA: Diagnosis not present

## 2022-02-11 DIAGNOSIS — K311 Adult hypertrophic pyloric stenosis: Secondary | ICD-10-CM

## 2022-02-11 DIAGNOSIS — T182XXA Foreign body in stomach, initial encounter: Secondary | ICD-10-CM | POA: Insufficient documentation

## 2022-02-11 DIAGNOSIS — X58XXXA Exposure to other specified factors, initial encounter: Secondary | ICD-10-CM | POA: Diagnosis not present

## 2022-02-11 HISTORY — PX: ESOPHAGOGASTRODUODENOSCOPY (EGD) WITH PROPOFOL: SHX5813

## 2022-02-11 HISTORY — PX: BALLOON DILATION: SHX5330

## 2022-02-11 SURGERY — ESOPHAGOGASTRODUODENOSCOPY (EGD) WITH PROPOFOL
Anesthesia: Monitor Anesthesia Care

## 2022-02-11 MED ORDER — SUCCINYLCHOLINE CHLORIDE 200 MG/10ML IV SOSY
PREFILLED_SYRINGE | INTRAVENOUS | Status: DC | PRN
Start: 2022-02-11 — End: 2022-02-11
  Administered 2022-02-11: 140 mg via INTRAVENOUS

## 2022-02-11 MED ORDER — DEXAMETHASONE SODIUM PHOSPHATE 10 MG/ML IJ SOLN
INTRAMUSCULAR | Status: DC | PRN
Start: 2022-02-11 — End: 2022-02-11
  Administered 2022-02-11: 10 mg via INTRAVENOUS

## 2022-02-11 MED ORDER — ONDANSETRON HCL 4 MG/2ML IJ SOLN
INTRAMUSCULAR | Status: DC | PRN
Start: 2022-02-11 — End: 2022-02-11
  Administered 2022-02-11: 4 mg via INTRAVENOUS

## 2022-02-11 MED ORDER — FENTANYL CITRATE (PF) 100 MCG/2ML IJ SOLN: 25.0000 ug | INTRAMUSCULAR | Status: DC | PRN

## 2022-02-11 MED ORDER — LACTATED RINGERS IV SOLN: INTRAVENOUS | Status: DC | PRN

## 2022-02-11 MED ORDER — OXYCODONE HCL 5 MG/5ML PO SOLN
5.0000 mg | Freq: Once | ORAL | Status: DC | PRN
  Filled 2022-02-11: qty 5

## 2022-02-11 MED ORDER — OMEPRAZOLE 40 MG PO CPDR: 40.0000 mg | DELAYED_RELEASE_CAPSULE | Freq: Every day | ORAL | 3 refills | Status: AC

## 2022-02-11 MED ORDER — PROPOFOL 500 MG/50ML IV EMUL
INTRAVENOUS | Status: DC | PRN
  Administered 2022-02-11: 25 ug/kg/min via INTRAVENOUS

## 2022-02-11 MED ORDER — ONDANSETRON HCL 4 MG/2ML IJ SOLN: 4.0000 mg | Freq: Once | INTRAMUSCULAR | Status: DC | PRN

## 2022-02-11 MED ORDER — SODIUM CHLORIDE 0.9 % IV SOLN: INTRAVENOUS | Status: DC

## 2022-02-11 MED ORDER — OXYCODONE HCL 5 MG PO TABS: 5.0000 mg | ORAL_TABLET | Freq: Once | ORAL | Status: DC | PRN

## 2022-02-11 MED ORDER — LIDOCAINE 2% (20 MG/ML) 5 ML SYRINGE
INTRAMUSCULAR | Status: DC | PRN
Start: 2022-02-11 — End: 2022-02-11
  Administered 2022-02-11: 60 mg via INTRAVENOUS

## 2022-02-11 MED ORDER — FENTANYL CITRATE (PF) 100 MCG/2ML IJ SOLN
INTRAMUSCULAR | Status: AC
  Filled 2022-02-11: qty 2

## 2022-02-11 MED ORDER — METOCLOPRAMIDE HCL 5 MG PO TABS: 5.0000 mg | ORAL_TABLET | Freq: Three times a day (TID) | ORAL | 1 refills | Status: AC | PRN

## 2022-02-11 MED ORDER — AMISULPRIDE (ANTIEMETIC) 5 MG/2ML IV SOLN
10.0000 mg | Freq: Once | INTRAVENOUS | Status: DC | PRN
  Filled 2022-02-11: qty 4

## 2022-02-11 MED ORDER — PROPOFOL 10 MG/ML IV BOLUS
INTRAVENOUS | Status: DC | PRN
  Administered 2022-02-11: 150 mg via INTRAVENOUS

## 2022-02-11 SURGICAL SUPPLY — 15 items

## 2022-02-11 NOTE — Transfer of Care (Signed)
Immediate Anesthesia Transfer of Care Note  Patient: INIKA BELLANGER  Procedure(s) Performed: ESOPHAGOGASTRODUODENOSCOPY (EGD) WITH PROPOFOL BALLOON DILATION  Patient Location: PACU  Anesthesia Type:General  Level of Consciousness: awake  Airway & Oxygen Therapy: Patient Spontanous Breathing  Post-op Assessment: Report given to RN and Post -op Vital signs reviewed and stable  Post vital signs: Reviewed and stable  Last Vitals:  Vitals Value Taken Time  BP 97/56 02/11/22 0847  Temp    Pulse 88 02/11/22 0848  Resp 13 02/11/22 0848  SpO2 98 % 02/11/22 0848  Vitals shown include unvalidated device data.  Last Pain:  Vitals:   02/11/22 0711  TempSrc: Oral  PainSc: 0-No pain         Complications: No notable events documented.

## 2022-02-11 NOTE — Op Note (Signed)
[facility] Patient Name: Kathleen Bird Procedure Date : 02/11/2022 MRN: [id number] Attending MD: Mauri Pole , MD Date of Birth: [birth year] CSN: [id number] Age: 46 Admit Type: Outpatient Procedure:                Upper GI endoscopy Indications:              Dysphagia, Stenosis of the esophagus Providers:                Mauri Pole, MD, Jaci Carrel, RN,                            Benetta Spar, Technician Referring MD:              Medicines:                General Anesthesia Complications:            No immediate complications. Estimated Blood Loss:     Estimated blood loss was minimal. Procedure:                Pre-Anesthesia Assessment:                           - Prior to the procedure, a History and Physical                            was performed, and patient medications and                            allergies were reviewed. The patient's tolerance of                            previous anesthesia was also reviewed. The risks                            and benefits of the procedure and the sedation                            options and risks were discussed with the patient.                            All questions were answered, and informed consent                            was obtained. Prior Anticoagulants: The patient has                            taken no previous anticoagulant or antiplatelet                            agents. ASA Grade Assessment: II - A patient with                            mild systemic disease. After reviewing the risks  and benefits, the patient was deemed in                            satisfactory condition to undergo the procedure.                           After obtaining informed consent, the endoscope was                            passed under direct vision. Throughout the                            procedure, the patient's blood pressure, pulse, and                             oxygen saturations were monitored continuously. The                            GIF-[id number] ([id number]) Olympus endoscope was introduced                            through the mouth, and advanced to the second part                            of duodenum. The upper GI endoscopy was                            accomplished without difficulty. The patient                            tolerated the procedure well. Scope In: Scope Out: Findings:      The lumen of the middle third of the esophagus and lower third of the       esophagus was mildly dilated.      One moderate (circumferential scarring or stenosis; an endoscope may       pass) stenosis was found 40 to 41 cm from the incisors. This stenosis       measured 1.5 cm (inner diameter). The stenosis was traversed. A TTS       dilator was passed through the scope. Dilation with an 18-19-20 mm       balloon dilator was performed to 20 mm. The dilation site was examined       and showed no change.      A large amount of a phytobezoar was found in the cardia, in the gastric       fundus and in the gastric antrum.      The exam of the stomach was otherwise normal. No evidence of gastric       outlet obstruction      The examined duodenum was normal. Impression:               - Dilation in the middle third of the esophagus and                            in the lower third of the esophagus.                           -  Esophageal stenosis. Dilated.                           - A large amount of a phytobezoar in the stomach.                           - Normal examined duodenum.                           - No specimens collected. Recommendation:           - Patient has a contact number available for                            emergencies. The signs and symptoms of potential                            delayed complications were discussed with the                            patient. Return to normal activities tomorrow.                             Written discharge instructions were provided to the                            patient.                           - Mechanical soft diet. Small meals with low                            reisdue diet                           - Continue present medications.                           - Follow an antireflux regimen.                           - Use Reglan '5mg'$  before meals and at bedtime as                            needed                           - Follow up in office visit, next available                            apointment in 4-6 weeks                           - Schedule barium esophagogram next available                            appointment Procedure Code(s):        ---  Professional ---                           [phone], Esophagogastroduodenoscopy, flexible,                            transoral; with transendoscopic balloon dilation of                            esophagus (less than 30 mm diameter) Diagnosis Code(s):        --- Professional ---                           K22.8, Other specified diseases of esophagus                           K22.2, Esophageal obstruction                           T18.2XXA, Foreign body in stomach, initial encounter                           R13.10, Dysphagia, unspecified CPT copyright 2019 [employer] Association. All rights reserved. The codes documented in this report are preliminary and upon coder review may  be revised to meet current compliance requirements. Mauri Pole, MD 02/11/2022 8:54:32 AM This report has been signed electronically. Number of Addenda: 0

## 2022-02-11 NOTE — Anesthesia Procedure Notes (Signed)
Procedure Name: Intubation Date/Time: 02/11/2022 8:15 AM Performed by: Erick Colace, CRNA Pre-anesthesia Checklist: Patient identified, Emergency Drugs available, Suction available and Patient being monitored Patient Re-evaluated:Patient Re-evaluated prior to induction Oxygen Delivery Method: Circle system utilized Preoxygenation: Pre-oxygenation with 100% oxygen Induction Type: IV induction, Cricoid Pressure applied and Rapid sequence Ventilation: Mask ventilation without difficulty Laryngoscope Size: Mac and 4 Grade View: Grade I Tube type: Oral Number of attempts: 1 Airway Equipment and Method: Stylet and Oral airway Placement Confirmation: ETT inserted through vocal cords under direct vision, positive ETCO2 and breath sounds checked- equal and bilateral Secured at: 22 cm Tube secured with: Tape Dental Injury: Teeth and Oropharynx as per pre-operative assessment

## 2022-02-11 NOTE — Anesthesia Postprocedure Evaluation (Signed)
Anesthesia Post Note  Patient: SHAWNICE TILMON  Procedure(s) Performed: ESOPHAGOGASTRODUODENOSCOPY (EGD) WITH PROPOFOL BALLOON DILATION     Patient location during evaluation: PACU Anesthesia Type: General Level of consciousness: awake and alert Pain management: pain level controlled Vital Signs Assessment: post-procedure vital signs reviewed and stable Respiratory status: spontaneous breathing, nonlabored ventilation and respiratory function stable Cardiovascular status: blood pressure returned to baseline and stable Postop Assessment: no apparent nausea or vomiting Anesthetic complications: no   No notable events documented.  Last Vitals:  Vitals:   02/11/22 0902 02/11/22 0917  BP: 93/73 95/63  Pulse: 77 66  Resp: 17 10  Temp:  36.7 C  SpO2: 99% 99%    Last Pain:  Vitals:   02/11/22 0917  TempSrc:   PainSc: 0-No pain                 Merlinda Frederick

## 2022-02-11 NOTE — H&P (Signed)
[facility] History and Physical   Primary Care Physician:  Harlan Stains, MD   Reason for Procedure:   Dysphagia, esophageal stricture  Plan:    EGD with esophageal dilation     HPI: Kathleen Bird is a 46 y.o. female with h/o esophageal cyst s/p resection and repair of hiatal hernia with esophageal dysphagia, plan for esophageal dilation The risks and benefits as well as alternatives of endoscopic procedure(s) have been discussed and reviewed. All questions answered. The patient agrees to proceed.    Past Medical History:  Diagnosis Date   Anxiety    on meds   Arthritis    bilateral hands   Complication of anesthesia    Depression    on meds   GERD (gastroesophageal reflux disease)    on meds   History of hiatal hernia    History of kidney stones    Migraines    on meds   PONV (postoperative nausea and vomiting)     Past Surgical History:  Procedure Laterality Date   COLONOSCOPY  12/2020   [facility]   DILATION AND CURETTAGE OF UTERUS     ESOPHAGEAL MANOMETRY N/A 01/02/2022   Procedure: ESOPHAGEAL MANOMETRY (EM);  Surgeon: Mauri Pole, MD;  Location: [facility];  Service: Endoscopy;  Laterality: N/A;   ESOPHAGOGASTRODUODENOSCOPY N/A 05/10/2021   Procedure: ESOPHAGOGASTRODUODENOSCOPY (EGD);  Surgeon: [name], MD;  Location: [facility] OR;  Service: Thoracic;  Laterality: N/A;   ESOPHAGOGASTRODUODENOSCOPY N/A 06/14/2021   Procedure: ESOPHAGOGASTRODUODENOSCOPY (EGD);  Surgeon: [name], MD;  Location: [facility];  Service: Thoracic;  Laterality: N/A;   EXTRACORPOREAL SHOCK WAVE LITHOTRIPSY Right 02/26/2021   Procedure: RIGHT EXTRACORPOREAL SHOCK WAVE LITHOTRIPSY (ESWL);  Surgeon: Franchot Gallo, MD;  Location: [facility];  Service: Urology;  Laterality: Right;   HAND SURGERY Left 2016   joint fusion in hand   tendon replacement Right 07/2020   UPPER GASTROINTESTINAL ENDOSCOPY  12/2020   WISDOM  TOOTH EXTRACTION     XI ROBOTIC ASSISTED HIATAL HERNIA REPAIR N/A 05/10/2021   Procedure: XI ROBOTIC ASSISTED LAPAROSCOPY HIATAL HERNIA REPAIR AND FUNDOPLICATION;  Surgeon: [name], MD;  Location: [place];  Service: Thoracic;  Laterality: N/A;    Prior to Admission medications   Medication Sig Start Date End Date Taking? Authorizing Provider  escitalopram (LEXAPRO) 20 MG tablet Take 20 mg by mouth at bedtime.   Yes [provider]  omeprazole (PRILOSEC) 40 MG capsule Take 1 capsule (40 mg total) by mouth 2 (two) times daily. Take for 3 months Patient taking differently: Take 40 mg by mouth daily. Take for 3 months 10/26/21  Yes Carla Whilden, Venia Minks, MD  SUMAtriptan (IMITREX) 100 MG tablet Take 100 mg by mouth every 2 (two) hours as needed for migraine. 08/22/21  Yes [provider]  Galcanezumab-gnlm 120 MG/ML SOAJ Inject 120 mg/mL into the skin every 30 (thirty) days.    [provider]  ondansetron (ZOFRAN ODT) 4 MG disintegrating tablet Take 1 tablet (4 mg total) by mouth every 8 (eight) hours as needed for nausea or vomiting. Patient not taking: Reported on 02/06/2022 05/14/21   Elgie Collard, PA-C  phentermine 15 MG capsule Take 15 mg by mouth daily. 01/29/22   [provider]  valACYclovir (VALTREX) 1000 MG tablet Take 1,000 mg by mouth 2 (two) times daily as needed (fever blister). 09/07/21   [provider]    No current facility-administered medications for this encounter.  Allergies as of 01/29/2022 - Review Complete 01/02/2022  Allergen Reaction Noted   Dilaudid [hydromorphone hcl] Nausea And Vomiting 04/10/2021    Family History  Problem Relation Age of Onset   Healthy Mother    Glaucoma Father    Colon polyps Neg Hx    Colon cancer Neg Hx    Esophageal cancer Neg Hx    Stomach cancer Neg Hx    Rectal cancer Neg Hx     Social History   Socioeconomic History   Marital status: Married    Spouse name: Not on file    Number of children: Not on file   Years of education: Not on file   Highest education level: Not on file  Occupational History   Not on file  Tobacco Use   Smoking status: Never   Smokeless tobacco: Never  Vaping Use   Vaping Use: Never used  Substance and Sexual Activity   Alcohol use: Not Currently   Drug use: Never   Sexual activity: Yes  Other Topics Concern   Not on file  Social History Narrative   Not on file   Social Determinants of Health   Financial Resource Strain: Not on file  Food Insecurity: Not on file  Transportation Needs: Not on file  Physical Activity: Not on file  Stress: Not on file  Social Connections: Not on file  Intimate Partner Violence: Not on file    Review of Systems:  All other review of systems negative except as mentioned in the HPI.  Physical Exam: Vital signs in last 24 hours: Temp:  [97.7 F (36.5 C)] 97.7 F (36.5 C) (05/22 0711) Pulse Rate:  [71] 71 (05/22 0711) Resp:  [12] 12 (05/22 0711) BP: (104)/(71) 104/71 (05/22 0711) SpO2:  [96 %] 96 % (05/22 0711) Weight:  [68.9 kg] 68.9 kg (05/22 0711)   General:   Alert, NAD Lungs:  Clear .   Heart:  Regular rate and rhythm Abdomen:  Soft, nontender and nondistended. Neuro/Psych:  Alert and cooperative. Normal mood and affect. A and O x 3   K. Denzil Magnuson , MD [phone]

## 2022-02-12 ENCOUNTER — Encounter (HOSPITAL_COMMUNITY): Payer: Self-pay | Admitting: Gastroenterology

## 2022-03-12 ENCOUNTER — Ambulatory Visit: Payer: BC Managed Care – PPO | Admitting: Gastroenterology

## 2022-03-12 ENCOUNTER — Encounter: Payer: Self-pay | Admitting: Gastroenterology

## 2022-03-12 VITALS — BP 120/72 | HR 77 | Ht 62.0 in | Wt 153.0 lb

## 2022-03-12 DIAGNOSIS — R1319 Other dysphagia: Secondary | ICD-10-CM

## 2022-03-12 DIAGNOSIS — K224 Dyskinesia of esophagus: Secondary | ICD-10-CM | POA: Diagnosis not present

## 2022-03-12 DIAGNOSIS — R1310 Dysphagia, unspecified: Secondary | ICD-10-CM

## 2022-03-12 NOTE — Progress Notes (Signed)
Kathleen Bird    [id number]    May 11, 1976  Primary Care Physician:White, Caren Griffins, MD  Referring Physician: Harlan Stains, MD [place],  [place] [address]   Chief complaint: Dysphagia HPI:  46 year old very pleasant female here for follow-up visit with complaints of dysphagia s/p lower esophageal duplication cyst removal and hiatal hernia repair.  She had revision of fundoplication, had some improvement of dysphagia but she feels certain foods are slow going down when she swallows specially dense breads, meat and  rice.  S/p EGD Feb 11, 2022 with esophageal dilation.  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  Overall she feels her symptoms are slowly improving. Her weight has been stable Denies any abdominal pain, vomiting, change in bowel habits, melena or rectal bleeding.   EGD 04/16/21 - LA Grade C reflux esophagitis with no bleeding. Biopsied. - Dilation in the middle third of the esophagus. - Gastroesophageal flap valve classified as Hill Grade III (minimal fold, loose to endoscope, hiatal hernia likely). - 3 cm hiatal hernia. - Non-bleeding gastric ulcers with no stigmata of bleeding. Biopsied. - Normal examined duodenum. 1. Surgical [P], gastric antrum and gastric body - REACTIVE GASTROPATHY - NO H. PYLORI OR INTESTINAL METAPLASIA IDENTIFIED - SEE COMMENT 2. Surgical [P], esophageal bx - SQUAMOCOLUMNAR JUNCTION WITH CHRONIC INFLAMMATION - NO INTESTINAL METAPLASIA, DYSPLASIA OR MALIGNANCY IDENTIFIED    Colonoscopy April 2022 by Dr. Therisa Doyne showed normal terminal ileum, mild erythematous mucosa in descending colon that was biopsied which showed no abnormality otherwise normal exam   Outpatient Encounter Medications as of 03/12/2022  Medication Sig   escitalopram (LEXAPRO) 20 MG tablet Take 20 mg by mouth at bedtime.   Galcanezumab-gnlm 120 MG/ML SOAJ Inject 120 mg/mL into the skin every 30 (thirty) days.   metoCLOPramide (REGLAN) 5 MG tablet Take 1 tablet (5 mg total) by mouth every 8 (eight) hours as needed for nausea or vomiting.   omeprazole (PRILOSEC) 40 MG capsule Take 1 capsule (40 mg total) by mouth daily.   SUMAtriptan (IMITREX) 100 MG tablet Take 100 mg by mouth every 2 (two) hours as needed for migraine.   valACYclovir (VALTREX) 1000 MG tablet Take 1,000 mg by mouth 2 (two) times daily as needed (fever blister).   No facility-administered encounter medications on file as of 03/12/2022.    Allergies as of 03/12/2022 - Review Complete 03/12/2022  Allergen Reaction Noted   Dilaudid [hydromorphone hcl] Nausea And Vomiting 04/10/2021    Past Medical History:  Diagnosis Date   Anxiety    on meds   Arthritis    bilateral hands   Complication of anesthesia    Depression    on meds   GERD (gastroesophageal reflux disease)    on meds   History of hiatal hernia    History of kidney stones    Migraines    on meds   PONV (postoperative nausea and vomiting)     Past Surgical History:  Procedure Laterality Date   BALLOON DILATION N/A  02/11/2022   Procedure: BALLOON DILATION;  Surgeon: Mauri Pole, MD;  Location: [place];  Service: Gastroenterology;  Laterality: N/A;   COLONOSCOPY  12/2020   [facility]   DILATION AND CURETTAGE OF UTERUS     ESOPHAGEAL MANOMETRY N/A 01/02/2022   Procedure: ESOPHAGEAL MANOMETRY (EM);  Surgeon: Mauri Pole, MD;  Location: [facility];  Service: Endoscopy;  Laterality: N/A;   ESOPHAGOGASTRODUODENOSCOPY N/A 05/10/2021    Procedure: ESOPHAGOGASTRODUODENOSCOPY (EGD);  Surgeon: [name], MD;  Location: [facility] OR;  Service: Thoracic;  Laterality: N/A;   ESOPHAGOGASTRODUODENOSCOPY N/A 06/14/2021   Procedure: ESOPHAGOGASTRODUODENOSCOPY (EGD);  Surgeon: [name], MD;  Location: [facility] OR;  Service: Thoracic;  Laterality: N/A;   ESOPHAGOGASTRODUODENOSCOPY (EGD) WITH PROPOFOL N/A 02/11/2022   Procedure: ESOPHAGOGASTRODUODENOSCOPY (EGD) WITH PROPOFOL;  Surgeon: Mauri Pole, MD;  Location: [place];  Service: Gastroenterology;  Laterality: N/A;   EXTRACORPOREAL SHOCK WAVE LITHOTRIPSY Right 02/26/2021   Procedure: RIGHT EXTRACORPOREAL SHOCK WAVE LITHOTRIPSY (ESWL);  Surgeon: Franchot Gallo, MD;  Location: [facility];  Service: Urology;  Laterality: Right;   HAND SURGERY Left 2016   joint fusion in hand   tendon replacement Right 07/2020   UPPER GASTROINTESTINAL ENDOSCOPY  12/2020   WISDOM TOOTH EXTRACTION     XI ROBOTIC ASSISTED HIATAL HERNIA REPAIR N/A 05/10/2021   Procedure: XI ROBOTIC ASSISTED LAPAROSCOPY HIATAL HERNIA REPAIR AND FUNDOPLICATION;  Surgeon: [name], MD;  Location: [facility] OR;  Service: Thoracic;  Laterality: N/A;    Family History  Problem Relation Age of Onset   Healthy Mother    Glaucoma Father    Colon polyps Neg Hx    Colon cancer Neg Hx    Esophageal cancer Neg Hx    Stomach cancer Neg Hx    Rectal cancer Neg Hx     Social History   Socioeconomic History   Marital status: Married    Spouse name: Not on file   Number of children: Not on file   Years of education: Not on file   Highest education level: Not on file  Occupational History   Not on file  Tobacco Use   Smoking status: Never   Smokeless tobacco: Never  Vaping Use   Vaping Use: Never used  Substance and Sexual Activity   Alcohol use: Not Currently   Drug use: Never   Sexual activity: Yes  Other Topics Concern   Not on file  Social History Narrative   Not on  file   Social Determinants of Health   Financial Resource Strain: Not on file  Food Insecurity: Not on file  Transportation Needs: Not on file  Physical Activity: Not on file  Stress: Not on file  Social Connections: Not on file  Intimate Partner Violence: Not on file      Review of systems: All other review of systems negative except as mentioned in the HPI.   Physical Exam: Vitals:   03/12/22 1521  BP: 120/72  Pulse: 77  SpO2: 98%   Body mass index is 27.98 kg/m. 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  S/p esophageal dilation with 20 mm TTS balloon with improvement of some of the symptoms We will continue to monitor If she has recurrent worsening of dysphagia associated with regurgitation, will consider referral to [facility] to consider possible endoscopic myotomy, POEM. Discussed with Dr. Kipp Brood, she has extensive scarring anteriorly and did not think will be able to take it down anteriorly where the hiatus was repaired  Return in 6 months or sooner if needed  this visit required 40 minutes of patient care (this includes precharting, chart review, review of results, face-to-face time used for counseling as well as treatment plan and follow-up. The patient was provided an opportunity to ask questions and all were answered. The patient agreed with the plan and demonstrated an understanding of the instructions.  Damaris Hippo , MD    CC: Harlan Stains, MD

## 2022-03-12 NOTE — Patient Instructions (Signed)
You will be contacted by [facility] in the next 2 days to arrange a Barium Swallow.  The number on your caller ID will be [phone], please answer when they call.  If you have not heard from them in 2 days please call [phone] to schedule.    The [facility] providers would like to encourage you to use [facility] to communicate with providers for non-urgent requests or questions.  Due to long hold times on the telephone, sending your provider a message by [facility] may be a faster and more efficient way to get a response.  Please allow 48 business hours for a response.  Please remember that this is for non-urgent requests.   Due to recent changes in healthcare laws, you may see the results of your imaging and laboratory studies on MyChart before your provider has had a chance to review them.  We understand that in some cases there may be results that are confusing or concerning to you. Not all laboratory results come back in the same time frame and the provider may be waiting for multiple results in order to interpret others.  Please give [place] 48 hours in order for your provider to thoroughly review all the results before contacting the office for clarification of your results.   Thank you for choosing me and [facility].  Pricilla Riffle. Dagoberto Ligas., MD., Marval Regal

## 2022-03-20 ENCOUNTER — Encounter: Payer: Self-pay | Admitting: Gastroenterology

## 2022-03-25 ENCOUNTER — Ambulatory Visit (HOSPITAL_COMMUNITY)
Admission: RE | Admit: 2022-03-25 | Discharge: 2022-03-25 | Disposition: A | Discharge status: Home / Self Care | Payer: BC Managed Care – PPO | Source: Ambulatory Visit | Attending: Gastroenterology | Admitting: Gastroenterology

## 2022-03-25 DIAGNOSIS — R1319 Other dysphagia: Secondary | ICD-10-CM | POA: Insufficient documentation

## 2022-04-23 ENCOUNTER — Encounter: Payer: Self-pay | Admitting: Gastroenterology

## 2022-07-11 ENCOUNTER — Encounter: Payer: Self-pay | Admitting: Gastroenterology

## 2022-07-12 ENCOUNTER — Telehealth: Payer: Self-pay | Admitting: Gastroenterology

## 2022-07-12 MED ORDER — FLUCONAZOLE 50 MG PO TABS: 100.0000 mg | ORAL_TABLET | Freq: Every day | ORAL | 1 refills | Status: AC

## 2022-07-12 NOTE — Telephone Encounter (Signed)
Kathleen Bird is having difficulty with any solid food, she is only tolerating mechanical soft diet.  She has sensation of choking with most meats or dense bread. She has halitosis and excessive belching with taste of rotten eggs, does not feel she is completely emptying from her esophagus  She is she has pathophysiology of achalsia, not a candidate for redo surgery at the GE junction, has extensive scar per Dr. Kipp Brood. Will refer to Dr. Maryjane Hurter at [facility] to evaluate and consider possible POEM procedure for management of her symptoms. We will send prescription for Diflucan 100 mg daily for 14 days Follow-up in office visit next available appointment.

## 2022-08-09 ENCOUNTER — Encounter: Payer: Self-pay | Admitting: Gastroenterology

## 2022-08-09 ENCOUNTER — Other Ambulatory Visit: Payer: Self-pay

## 2022-08-13 ENCOUNTER — Ambulatory Visit: Payer: BC Managed Care – PPO

## 2022-08-13 ENCOUNTER — Ambulatory Visit
Admission: RE | Admit: 2022-08-13 | Discharge: 2022-08-13 | Disposition: A | Discharge status: Home / Self Care | Payer: BC Managed Care – PPO | Source: Ambulatory Visit | Attending: Obstetrics and Gynecology | Admitting: Obstetrics and Gynecology

## 2022-08-13 DIAGNOSIS — N6310 Unspecified lump in the right breast, unspecified quadrant: Secondary | ICD-10-CM

## 2022-08-27 ENCOUNTER — Telehealth: Payer: Self-pay

## 2022-08-27 NOTE — Telephone Encounter (Signed)
Esophageal manometry report faxed to Dr Maryjane Hurter for referral to [phone].

## 2022-10-01 IMAGING — DX DG CHEST 1V PORT
1 series · 1 of 1 positions shown · non-contrast
Comparison: Chest x-ray dated January 06, 2021

CLINICAL DATA: Postop esophageal hernia repair

EXAM:
PORTABLE CHEST 1 VIEW

[chest]
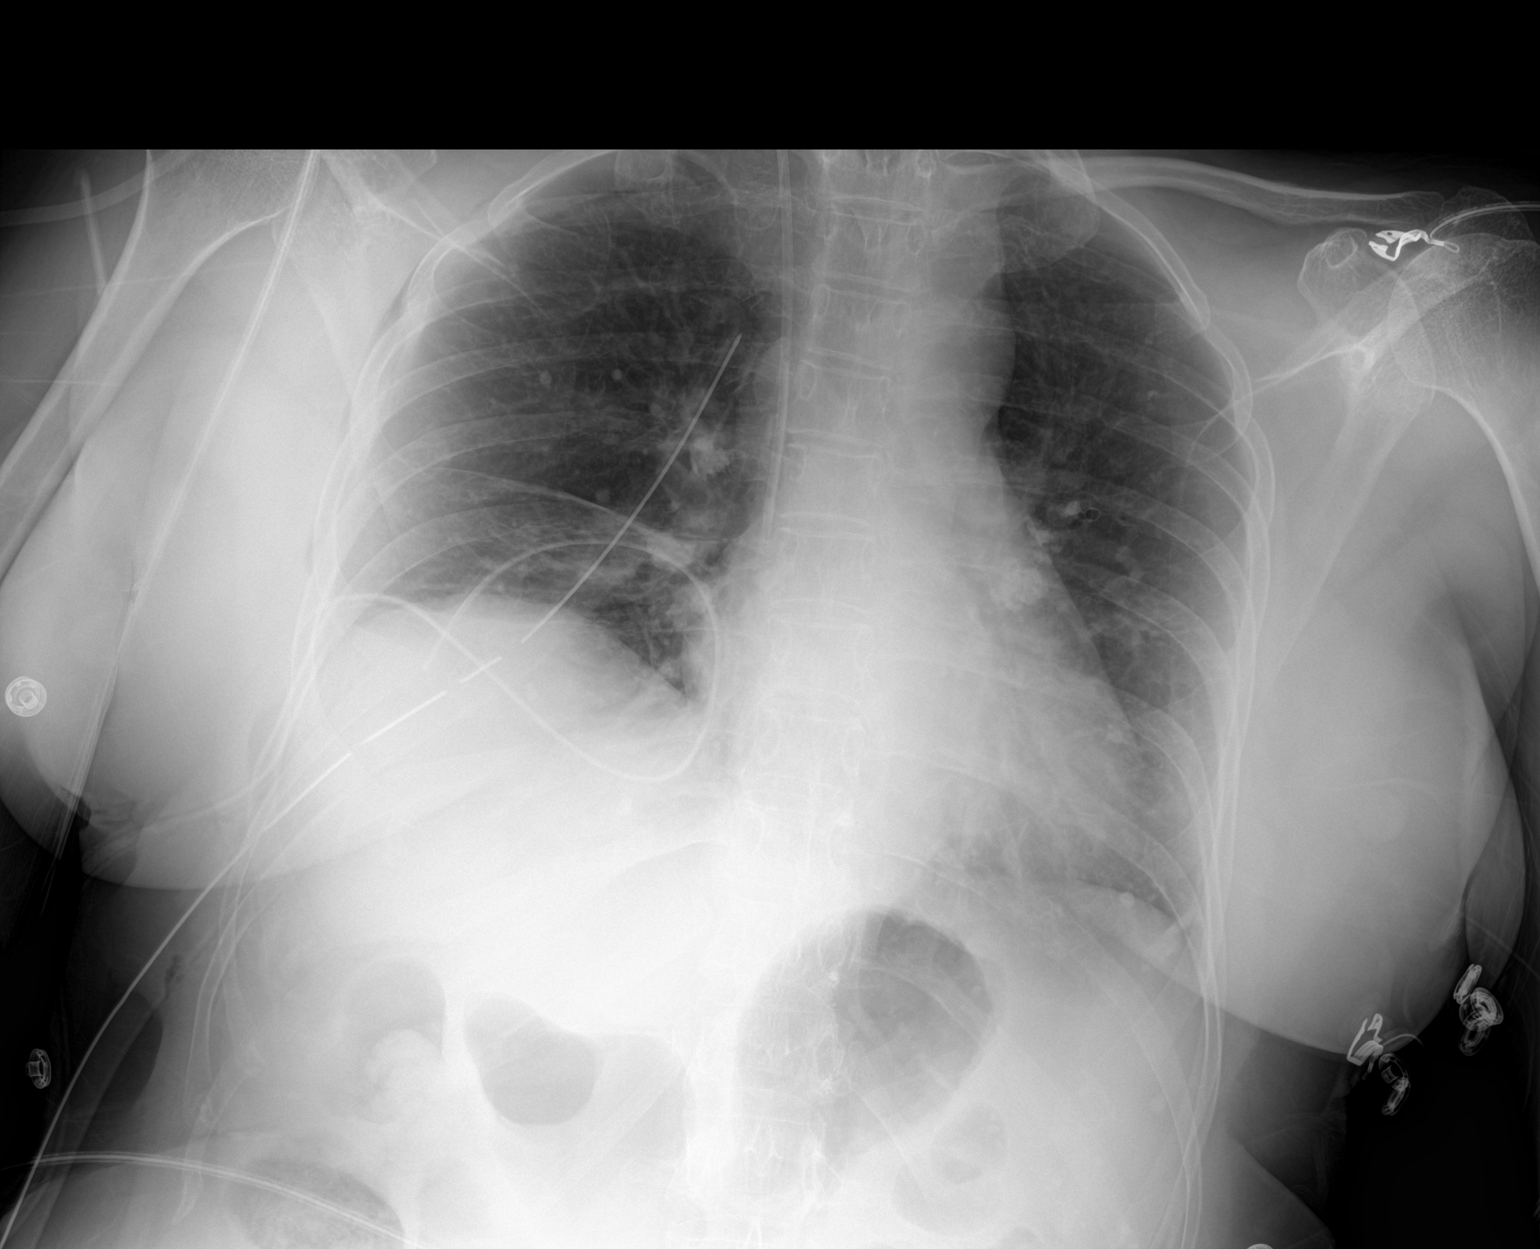

[1 of 1 positions shown; findings below may reference images not displayed]

FINDINGS: Right IJ line with tip positioned at the superior cavoatrial
junction. Right-sided chest tube. Possible tract right pleural
effusion. No evident pneumothorax. Soft tissue gas of the right
chest wall. Right basilar atelectasis.

Cardiac and mediastinal contours within normal limits for AP
technique.
IMPRESSION: No evidence of pneumothorax.  Right-sided chest tube is place

Right basilar atelectasis.

## 2023-06-03 ENCOUNTER — Other Ambulatory Visit: Payer: Self-pay | Admitting: Obstetrics and Gynecology

## 2023-06-03 DIAGNOSIS — N6310 Unspecified lump in the right breast, unspecified quadrant: Secondary | ICD-10-CM

## 2023-06-03 DIAGNOSIS — R928 Other abnormal and inconclusive findings on diagnostic imaging of breast: Secondary | ICD-10-CM

## 2023-07-01 IMAGING — MG MM DIGITAL DIAGNOSTIC UNILAT*R* W/ TOMO W/ CAD
4 series · 4 of 12 positions shown · non-contrast
Comparison: Previous exam(s).

CLINICAL DATA: 45-year-old female presenting for first six-month
follow-up of probably benign right breast masses.

EXAM:
DIGITAL DIAGNOSTIC UNILATERAL RIGHT MAMMOGRAM WITH TOMOSYNTHESIS AND
CAD; ULTRASOUND RIGHT BREAST LIMITED
TECHNIQUE: Right digital diagnostic mammography and breast tomosynthesis was
performed. The images were evaluated with computer-aided detection.;
Targeted ultrasound examination of the right breast was performed

[R CC synth-2D]
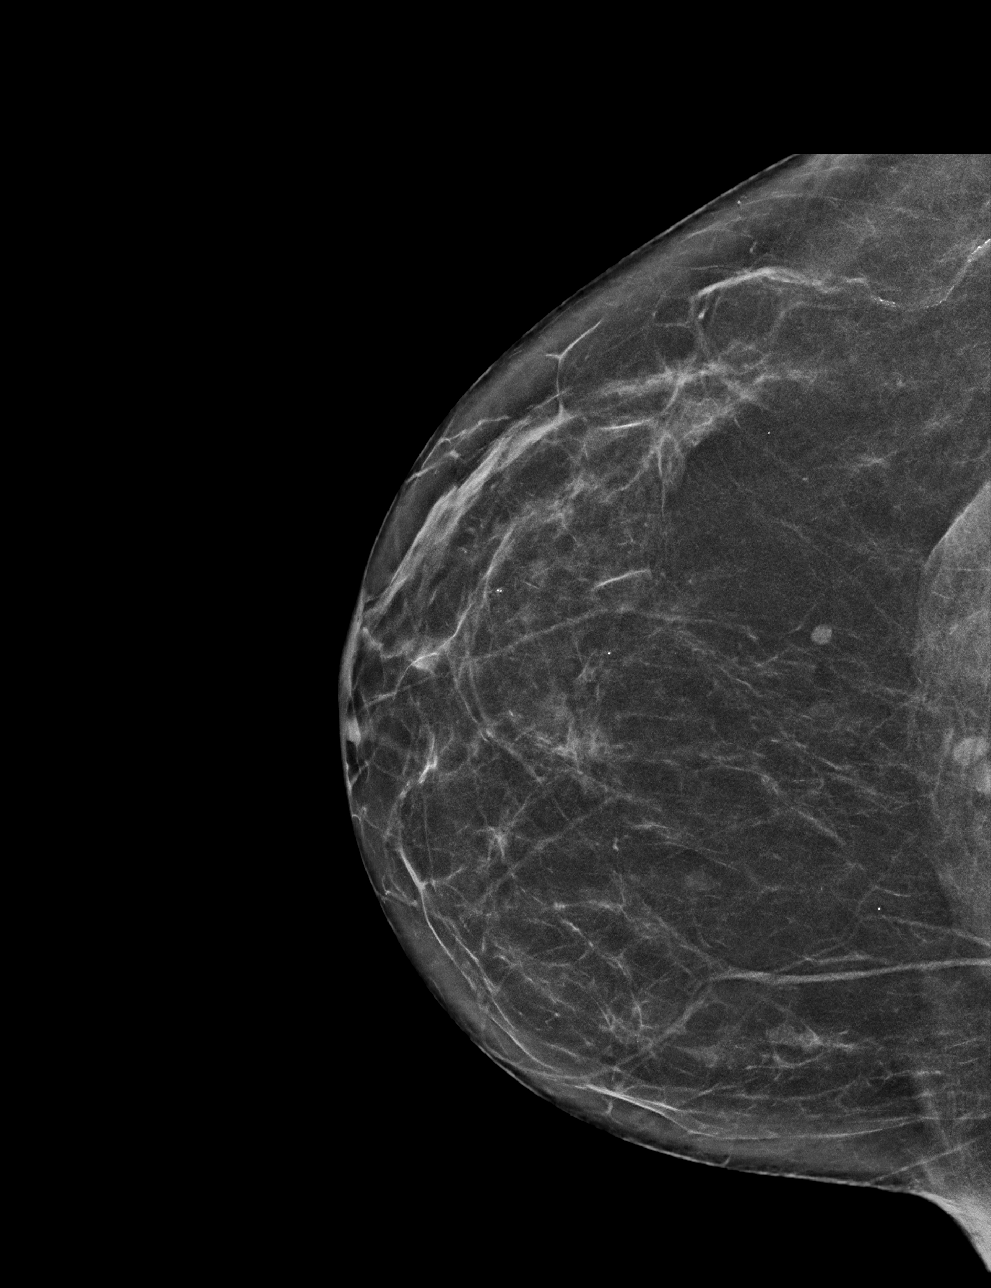

[R MLO synth-2D]
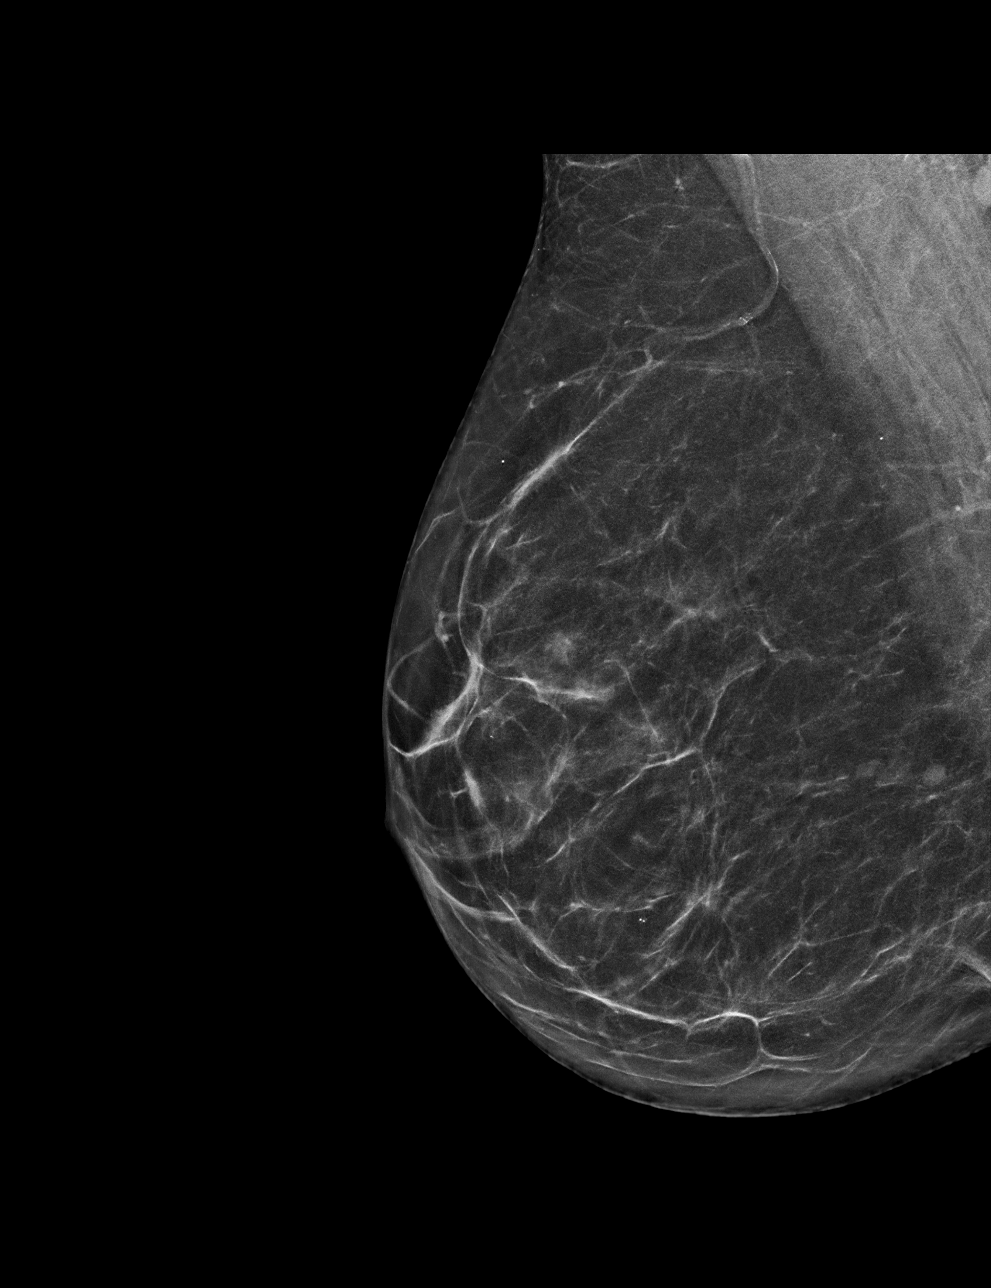

[R MLO tomo · tomo slice 32/63.0]
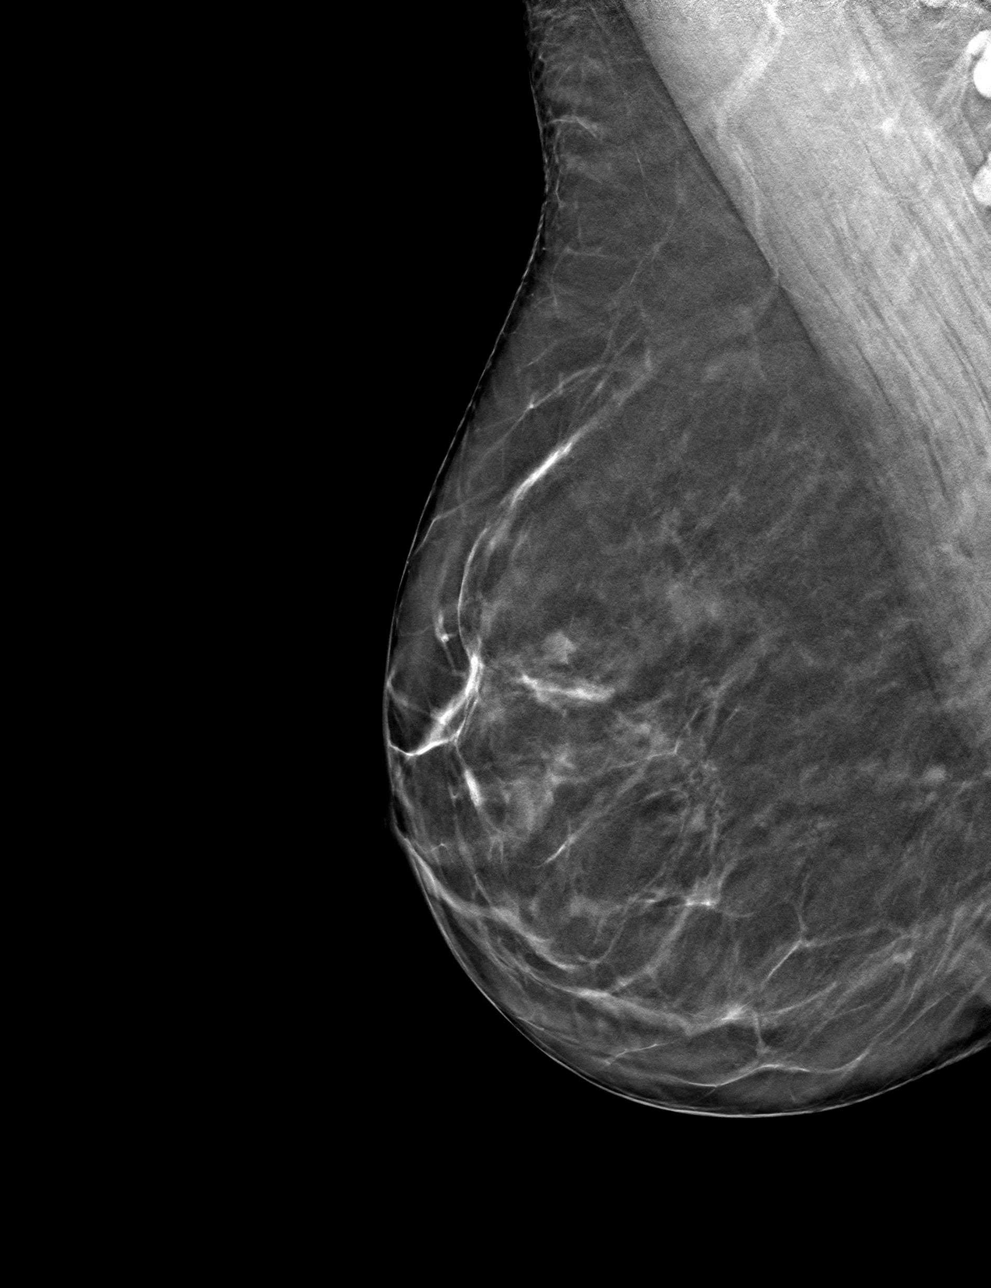

[R CC tomo · tomo slice 29/58.0]
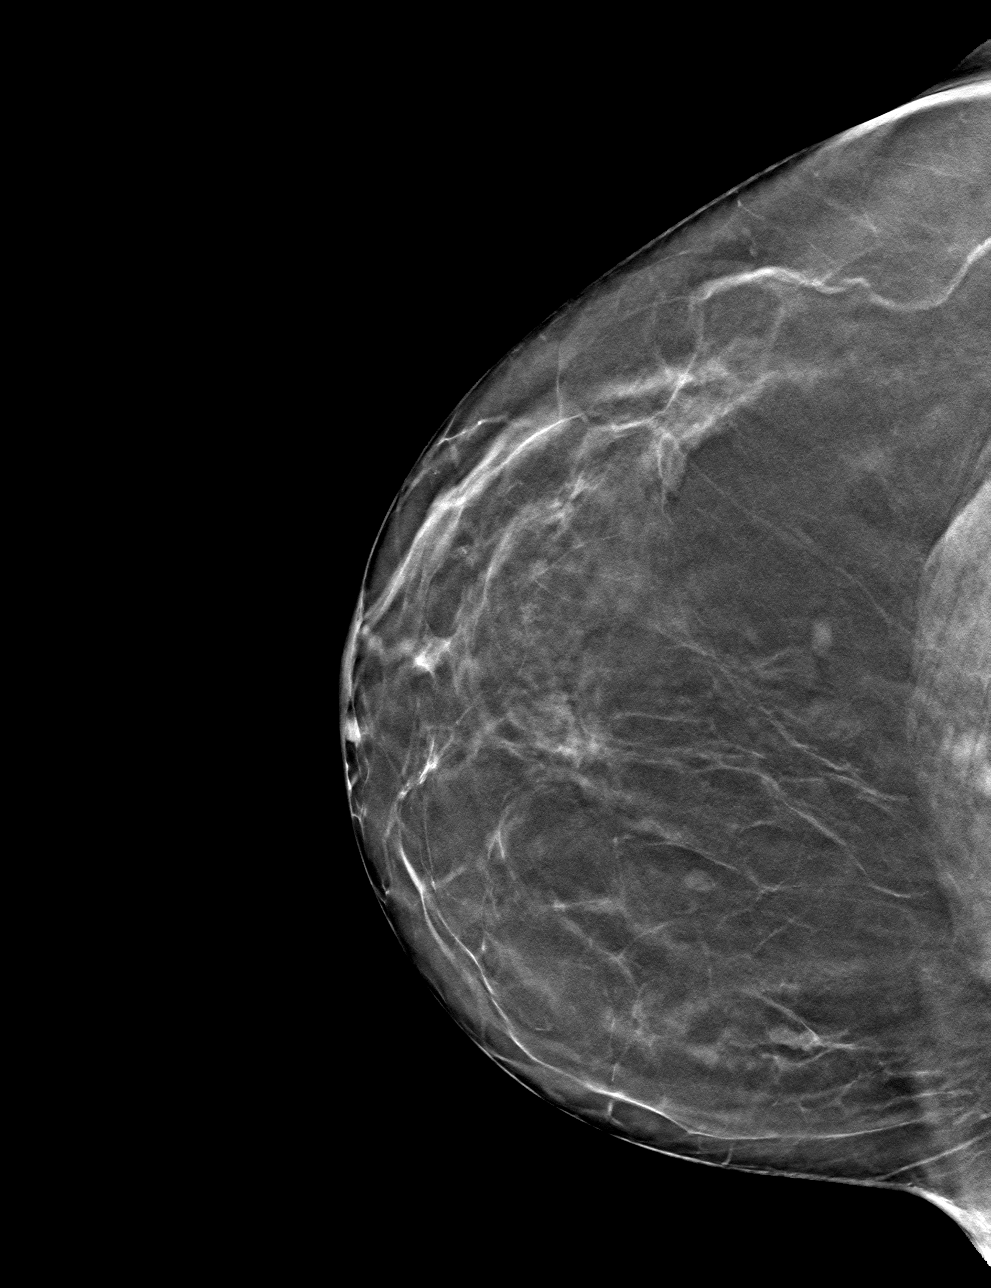

[4 of 12 positions shown; findings below may reference images not displayed]

ACR Breast Density Category b: There are scattered areas of
fibroglandular density.
FINDINGS: Oval, circumscribed masses in the deep central and medial right
breast are mammographically stable. Further evaluation with
ultrasound was performed.

Targeted ultrasound is performed, showing stable appearance of a
probable intramammary lymph node at the 5 o'clock position 6 cm from
the nipple. It measures 5 x 4 x 3 mm. An additional, likely
intramammary lymph node at the 6 o'clock position 1 cm from the
nipple likely corresponds with the central mammographically
identified mass. It measures 3 x 3 x 2 mm.
IMPRESSION: Stable, probably benign right breast masses. Recommend continued
short-term follow-up.

RECOMMENDATION:
Bilateral diagnostic mammogram and possible right breast ultrasound
in 6 months.

I have discussed the findings and recommendations with the patient.
If applicable, a reminder letter will be sent to the patient
regarding the next appointment.

BI-RADS CATEGORY  3: Probably benign.

## 2023-07-01 IMAGING — US US BREAST*R* LIMITED INC AXILLA
1 series · 12 of 12 positions shown · non-contrast
Comparison: Previous exam(s).

CLINICAL DATA: 45-year-old female presenting for first six-month
follow-up of probably benign right breast masses.

EXAM:
DIGITAL DIAGNOSTIC UNILATERAL RIGHT MAMMOGRAM WITH TOMOSYNTHESIS AND
CAD; ULTRASOUND RIGHT BREAST LIMITED
TECHNIQUE: Right digital diagnostic mammography and breast tomosynthesis was
performed. The images were evaluated with computer-aided detection.;
Targeted ultrasound examination of the right breast was performed

[Series 1: us breast*right* limited inc axilla · 0.07mm/px · 12 of 12 slices shown]
[im 1/12]
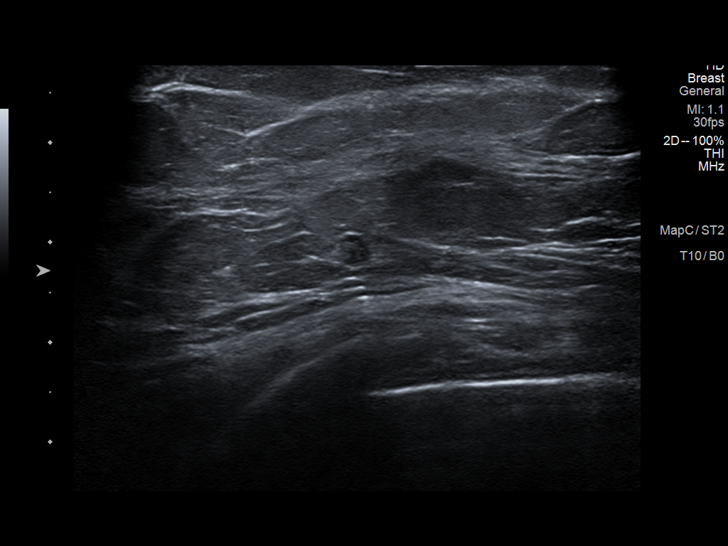
[im 2/12]
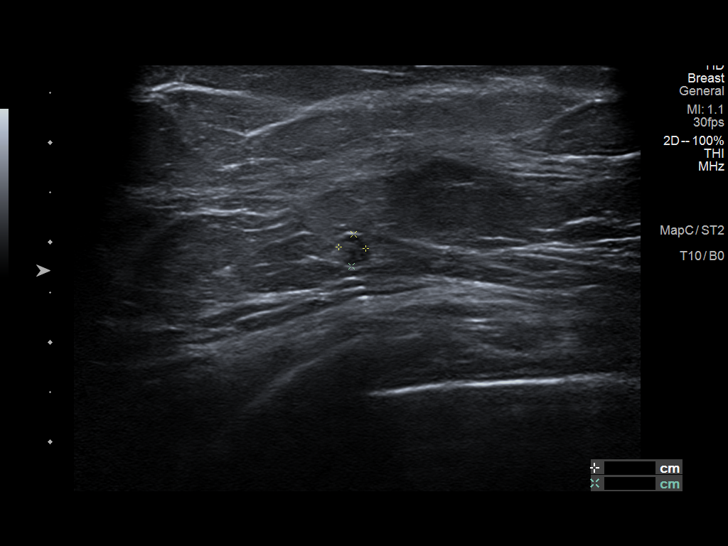
[im 3/12]
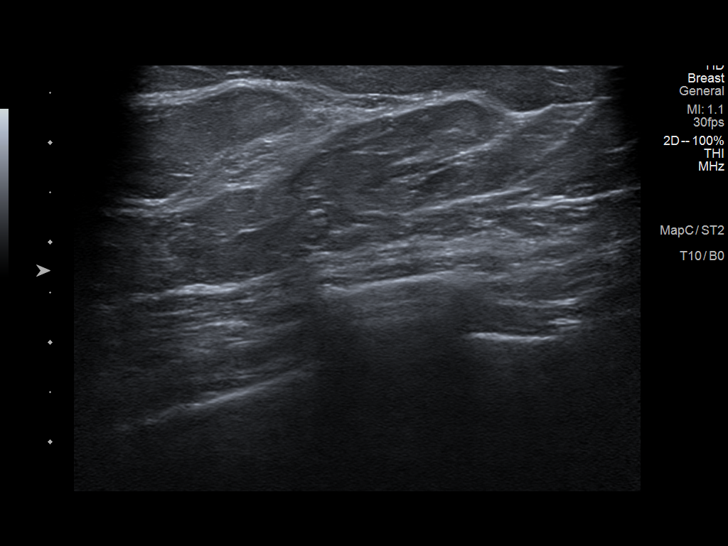
[im 4/12]
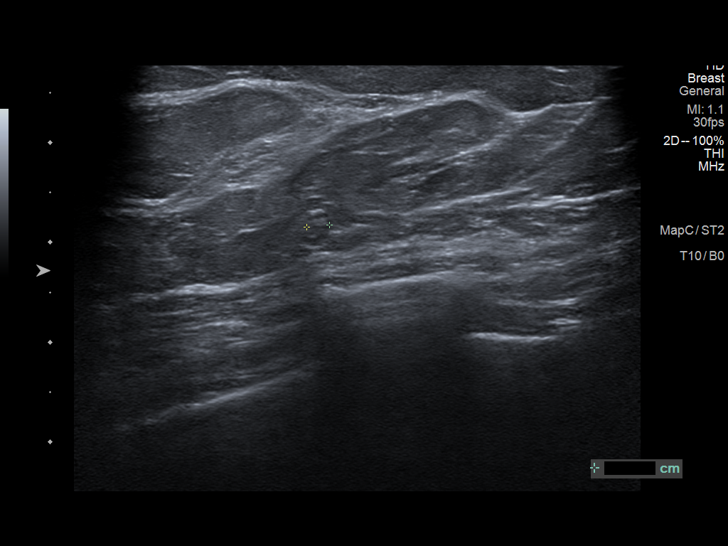
[im 5/12]
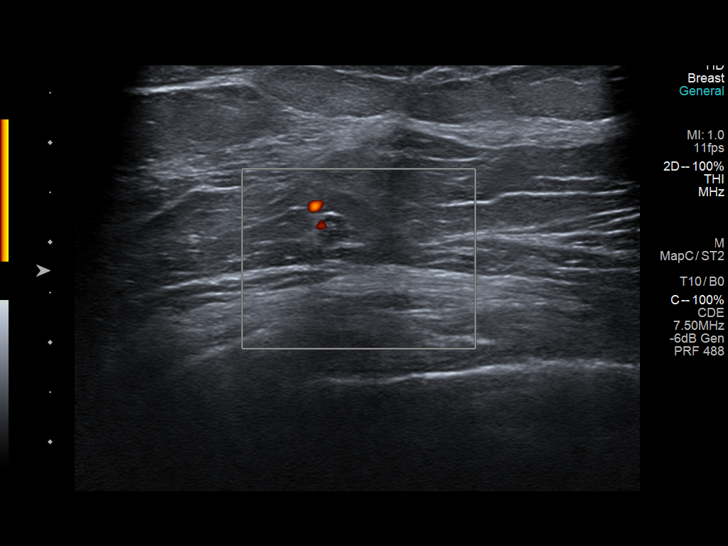
[im 6/12]
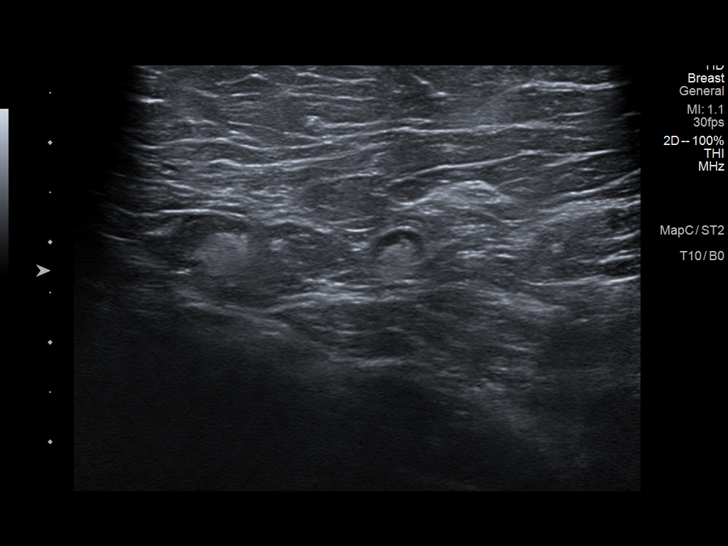
[im 7/12]
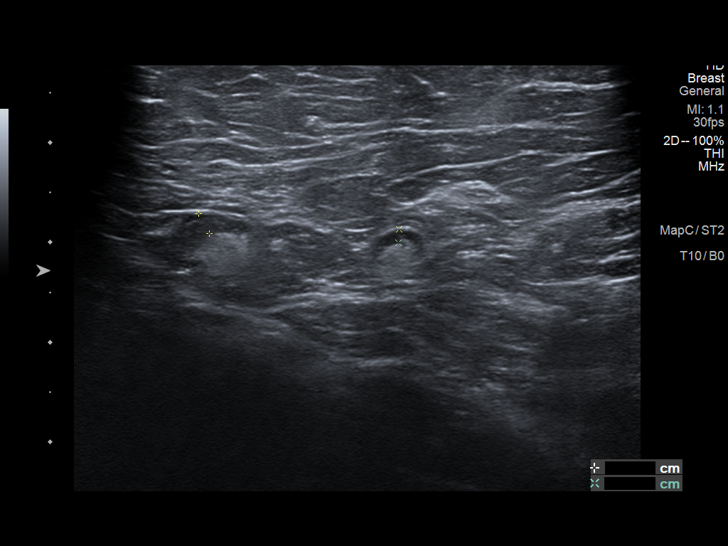
[im 8/12]
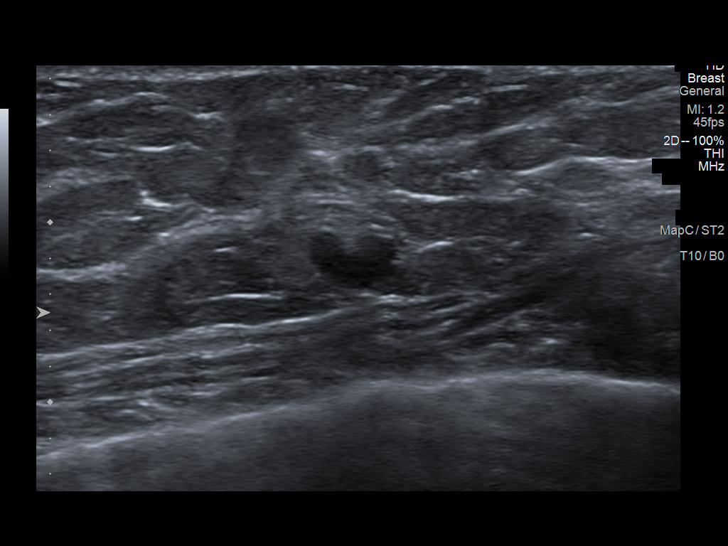
[im 9/12]
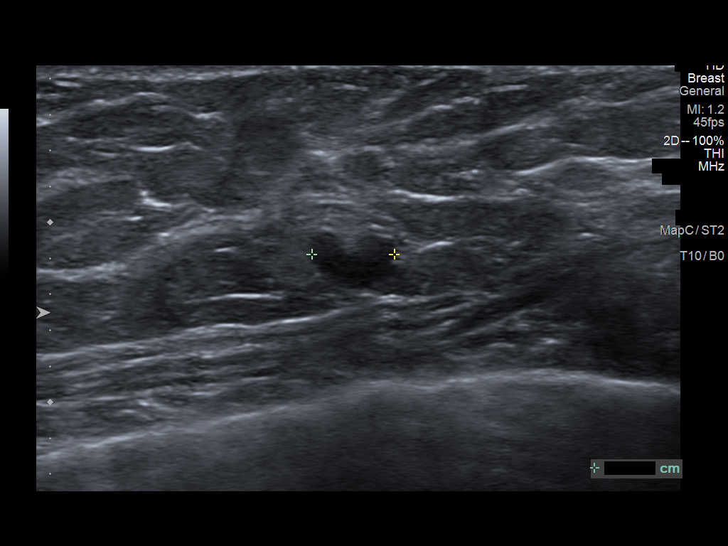
[im 10/12]
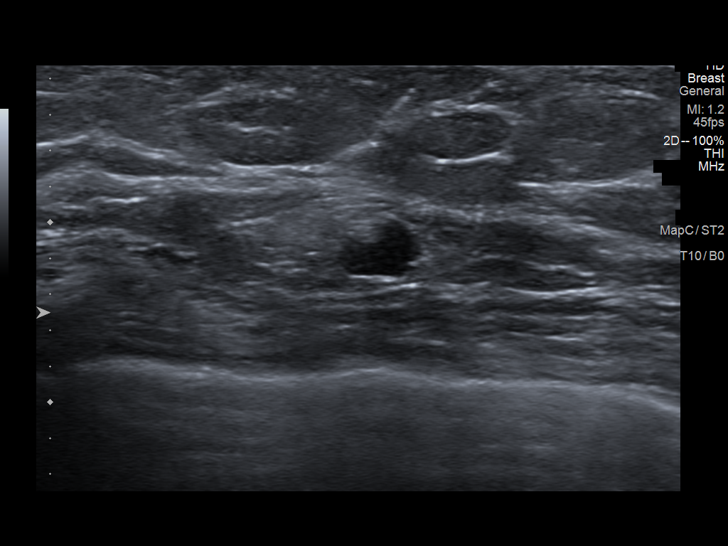
[im 11/12]
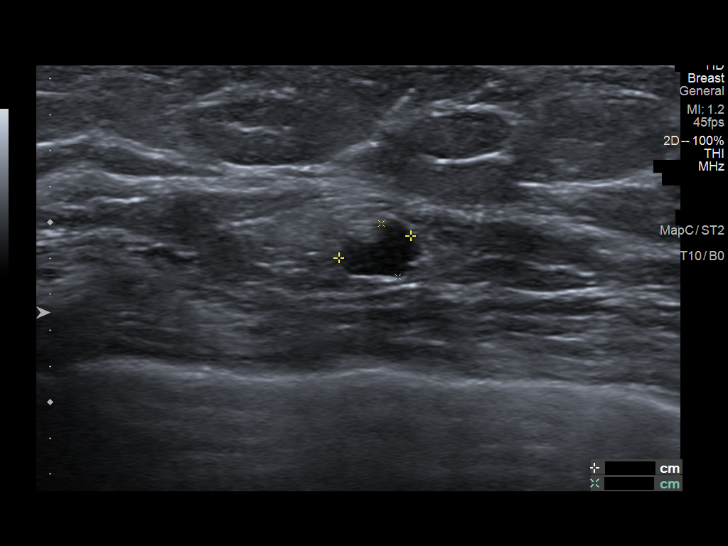
[im 12/12]
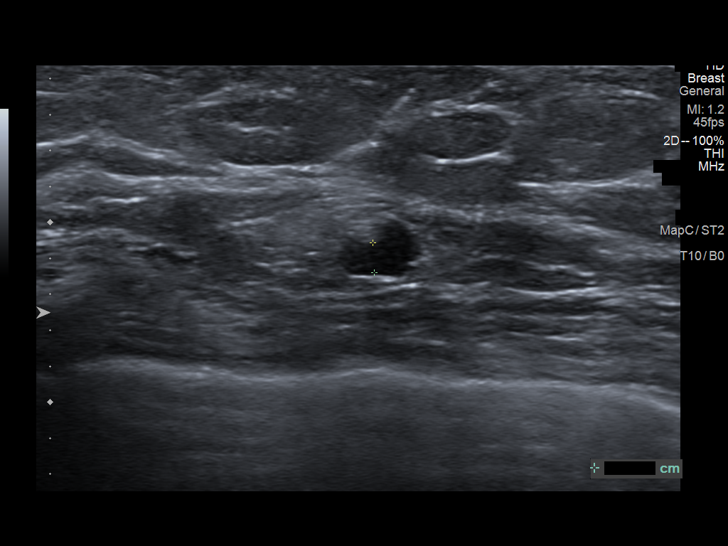

[12 of 12 positions shown; findings below may reference images not displayed]

ACR Breast Density Category b: There are scattered areas of
fibroglandular density.
FINDINGS: Oval, circumscribed masses in the deep central and medial right
breast are mammographically stable. Further evaluation with
ultrasound was performed.

Targeted ultrasound is performed, showing stable appearance of a
probable intramammary lymph node at the 5 o'clock position 6 cm from
the nipple. It measures 5 x 4 x 3 mm. An additional, likely
intramammary lymph node at the 6 o'clock position 1 cm from the
nipple likely corresponds with the central mammographically
identified mass. It measures 3 x 3 x 2 mm.
IMPRESSION: Stable, probably benign right breast masses. Recommend continued
short-term follow-up.

RECOMMENDATION:
Bilateral diagnostic mammogram and possible right breast ultrasound
in 6 months.

I have discussed the findings and recommendations with the patient.
If applicable, a reminder letter will be sent to the patient
regarding the next appointment.

BI-RADS CATEGORY  3: Probably benign.

## 2023-08-15 ENCOUNTER — Ambulatory Visit
Admission: RE | Admit: 2023-08-15 | Discharge: 2023-08-15 | Disposition: A | Discharge status: Home / Self Care | Payer: BC Managed Care – PPO | Source: Ambulatory Visit | Attending: Obstetrics and Gynecology | Admitting: Obstetrics and Gynecology

## 2023-08-15 DIAGNOSIS — N6310 Unspecified lump in the right breast, unspecified quadrant: Secondary | ICD-10-CM

## 2023-08-15 DIAGNOSIS — R928 Other abnormal and inconclusive findings on diagnostic imaging of breast: Secondary | ICD-10-CM

## 2023-11-17 ENCOUNTER — Other Ambulatory Visit: Payer: Self-pay | Admitting: Endocrinology

## 2023-11-17 DIAGNOSIS — E049 Nontoxic goiter, unspecified: Secondary | ICD-10-CM

## 2023-12-01 ENCOUNTER — Ambulatory Visit
Admission: RE | Admit: 2023-12-01 | Discharge: 2023-12-01 | Disposition: A | Discharge status: Home / Self Care | Payer: Self-pay | Source: Ambulatory Visit | Attending: Endocrinology | Admitting: Endocrinology

## 2023-12-01 DIAGNOSIS — E049 Nontoxic goiter, unspecified: Secondary | ICD-10-CM

## 2024-05-13 ENCOUNTER — Ambulatory Visit: Admitting: Plastic Surgery

## 2024-05-13 ENCOUNTER — Encounter: Payer: Self-pay | Admitting: Plastic Surgery

## 2024-05-13 VITALS — BP 113/77 | HR 77 | Ht 62.0 in | Wt 136.6 lb

## 2024-05-13 DIAGNOSIS — L989 Disorder of the skin and subcutaneous tissue, unspecified: Secondary | ICD-10-CM | POA: Diagnosis not present

## 2024-05-13 NOTE — Progress Notes (Signed)
 Referring Provider Kathleen Channel, MD [phone] [address] Kathleen Bird [address] [place],  [place] [address]   CC:  Chief Complaint  Patient presents with   Advice Only      Kathleen Bird is an 48 y.o. female.  HPI: Ms. Hand is a 48 year old female who presents today with a cyst on the left cheek.  It is unclear how long this has been present but she has a long history of developing cysts mostly on the back.  She states these are usually removed and are benign.  She would like to have the cyst on her face removed  Allergies  Allergen Reactions   Dilaudid  [Hydromorphone  Hcl] Nausea And Vomiting    Outpatient Encounter Medications as of 05/13/2024  Medication Sig   escitalopram (LEXAPRO) 20 MG tablet Take 20 mg by mouth at bedtime.   Galcanezumab-gnlm 120 MG/ML SOAJ Inject 120 mg/mL into the skin every 30 (thirty) days.   metoCLOPramide  (REGLAN ) 5 MG tablet Take 1 tablet (5 mg total) by mouth every 8 (eight) hours as needed for nausea or vomiting.   omeprazole  (PRILOSEC) 40 MG capsule Take 1 capsule (40 mg total) by mouth daily.   SUMAtriptan  (IMITREX ) 100 MG tablet Take 100 mg by mouth every 2 (two) hours as needed for migraine.   valACYclovir (VALTREX) 1000 MG tablet Take 1,000 mg by mouth 2 (two) times daily as needed (fever blister).   fluconazole  (DIFLUCAN ) 50 MG tablet Take 2 tablets (100 mg total) by mouth daily. (Patient not taking: Reported on 05/13/2024)   No facility-administered encounter medications on file as of 05/13/2024.     Past Medical History:  Diagnosis Date   Anxiety    on meds   Arthritis    bilateral hands   Complication of anesthesia    Depression    on meds   GERD (gastroesophageal reflux disease)    on meds   History of hiatal hernia    History of kidney stones    Migraines    on meds   PONV (postoperative nausea and vomiting)     Past Surgical History:  Procedure Laterality Date   BALLOON DILATION N/A 02/11/2022   Procedure: BALLOON DILATION;   Surgeon: Kathleen Gustav GAILS, MD;  Location: [facility];  Service: Gastroenterology;  Laterality: N/A;   COLONOSCOPY  12/2020   [facility]   DILATION AND CURETTAGE OF UTERUS     ESOPHAGEAL MANOMETRY N/A 01/02/2022   Procedure: ESOPHAGEAL MANOMETRY (EM);  Surgeon: Kathleen Gustav GAILS, MD;  Location: [facility];  Service: Endoscopy;  Laterality: N/A;   ESOPHAGOGASTRODUODENOSCOPY N/A 05/10/2021   Procedure: ESOPHAGOGASTRODUODENOSCOPY (EGD);  Surgeon: Kathleen Linnie KIDD, MD;  [facility];  Service: Thoracic;  Laterality: N/A;   ESOPHAGOGASTRODUODENOSCOPY N/A 06/14/2021   Procedure: ESOPHAGOGASTRODUODENOSCOPY (EGD);  Surgeon: Kathleen Linnie KIDD, MD;  Location: [facility] OR;  Service: Thoracic;  Laterality: N/A;   ESOPHAGOGASTRODUODENOSCOPY (EGD) WITH PROPOFOL  N/A 02/11/2022   Procedure: ESOPHAGOGASTRODUODENOSCOPY (EGD) WITH PROPOFOL ;  Surgeon: Kathleen Gustav GAILS, MD;  Location: [facility];  Service: Gastroenterology;  Laterality: N/A;   EXTRACORPOREAL SHOCK WAVE LITHOTRIPSY Right 02/26/2021   Procedure: RIGHT EXTRACORPOREAL SHOCK WAVE LITHOTRIPSY (ESWL);  Surgeon: Kathleen Senior, MD;  Location[facility];  Service: Urology;  Laterality: Right;   HAND SURGERY Left 2016   joint fusion in hand   tendon replacement Right 07/2020   UPPER GASTROINTESTINAL ENDOSCOPY  12/2020   WISDOM TOOTH EXTRACTION     XI ROBOTIC ASSISTED HIATAL HERNIA REPAIR N/A 05/10/2021  Procedure: XI ROBOTIC ASSISTED LAPAROSCOPY HIATAL HERNIA REPAIR AND FUNDOPLICATION;  Surgeon: Kathleen Linnie KIDD, MD;  Location: [facility];  Service: Thoracic;  Laterality: N/A;    Family History  Problem Relation Age of Onset   Healthy Mother    Glaucoma Father    Colon polyps Neg Hx    Colon cancer Neg Hx    Esophageal cancer Neg Hx    Stomach cancer Neg Hx    Rectal cancer Neg Hx     Social History   Social History Narrative   Not on file     Review of Systems General: Denies fevers, chills,  weight loss CV: Denies chest pain, shortness of breath, palpitations Skin: Mass on the left cheek which is nontender to palpation.  She does not report any drainage.  Physical Exam    05/13/2024    2:39 PM 03/12/2022    3:21 PM 02/11/2022    9:17 AM  Vitals with BMI  Height 5' 2 5' 2   Weight 136 lbs 10 oz 153 lbs   BMI 24.98 27.98   Systolic 113 120 95  Diastolic 77 72 63  Pulse 77 77 66    General:  No acute distress,  Alert and oriented, Non-Toxic, Normal speech and affect Skin: 1 x 1 cm hyperpigmented lesion on the left cheek.  It is mobile and well-circumscribed. Mammogram: Not applicable Assessment/Plan Skin lesion: Patient has a small skin lesion on the left cheek.  I believe this would be readily excised in the office.  I showed her where I planned to place the incision.  We discussed the fact that there will be a scar.  I told her that I do close these incisions with permanent sutures and she will need to follow-up 5 to 7 days after surgery.  The tissue will be sent to pathology for routine examination.  Will schedule an in office excision.  Kathleen Bird 05/13/2024, 6:46 PM

## 2024-05-31 ENCOUNTER — Ambulatory Visit: Admitting: Plastic Surgery

## 2024-05-31 VITALS — BP 116/80 | HR 73

## 2024-05-31 DIAGNOSIS — L989 Disorder of the skin and subcutaneous tissue, unspecified: Secondary | ICD-10-CM

## 2024-05-31 DIAGNOSIS — D489 Neoplasm of uncertain behavior, unspecified: Secondary | ICD-10-CM

## 2024-05-31 DIAGNOSIS — L720 Epidermal cyst: Secondary | ICD-10-CM | POA: Diagnosis not present

## 2024-05-31 NOTE — Progress Notes (Signed)
 Procedure Note  Preoperative Dx: Left cheek cyst  Postoperative Dx: Same  Procedure: Excision of left cheek cyst  Anesthesia: Lidocaine  1% with 1:100,000 epinephrine and 0.25% Sensorcaine    Indication for Procedure: Removal for pathologic diagnosis  Description of Procedure: Risks and complications were explained to the patient including the need for additional procedures based on pathology and the possibility of a scar.  Consent was confirmed and the patient understands the risks and benefits.  The potential complications and alternatives were explained and the patient consents.  The patient expressed understanding the option of not having the procedure and the risks of a scar.  Time out was called and all information was confirmed to be correct.    The area was prepped and drapped.  Local anesthetic was injected in the subcutaneous tissues.  After waiting for the local to take affect an elliptical incision was made around the cyst and a combination of sharp and blunt dissection were used to isolate and remove the cyst..  After obtaining hemostasis, the surgical wound was closed with interrupted 5-0 Prolene sutures.  The surgical wound measured 1 cm.  A dressing was applied.  The patient was given instructions on how to care for the area and a follow up appointment.  Kathleen Bird tolerated the procedure well and there were no complications. The specimen was sent to pathology.

## 2024-06-02 LAB — DERMATOLOGY PATHOLOGY

## 2024-06-07 ENCOUNTER — Ambulatory Visit (INDEPENDENT_AMBULATORY_CARE_PROVIDER_SITE_OTHER): Payer: Self-pay | Admitting: Plastic Surgery

## 2024-06-07 VITALS — BP 110/77 | HR 72

## 2024-06-07 DIAGNOSIS — L989 Disorder of the skin and subcutaneous tissue, unspecified: Secondary | ICD-10-CM

## 2024-06-07 DIAGNOSIS — Z9889 Other specified postprocedural states: Secondary | ICD-10-CM

## 2024-06-07 NOTE — Progress Notes (Signed)
 Kathleen Bird returns today 1 week postop from removal of a cyst on the left side of her face.  She is doing well with no specific complaints.  One of her sutures did fall out.  The incision is healing nicely.  Sutures removed without difficulty.  Pathology which showed an epidermoid cyst was reviewed with the patient.  There is no atypia.  No specific follow-up is required.  We did discuss scar massage and sun avoidance.  She will follow-up with me as needed.

## 2024-07-19 ENCOUNTER — Other Ambulatory Visit: Payer: Self-pay | Admitting: Obstetrics and Gynecology

## 2024-07-19 DIAGNOSIS — Z1231 Encounter for screening mammogram for malignant neoplasm of breast: Secondary | ICD-10-CM

## 2024-08-09 ENCOUNTER — Other Ambulatory Visit: Payer: Self-pay | Admitting: Medical Genetics

## 2024-08-16 ENCOUNTER — Ambulatory Visit
Admission: RE | Admit: 2024-08-16 | Discharge: 2024-08-16 | Disposition: A | Source: Ambulatory Visit | Attending: Obstetrics and Gynecology | Admitting: Obstetrics and Gynecology

## 2024-08-16 DIAGNOSIS — Z1231 Encounter for screening mammogram for malignant neoplasm of breast: Secondary | ICD-10-CM
